# Patient Record
Sex: Female | Born: 1970 | Race: White | Hispanic: No | Marital: Married | Smoking: Never smoker
Health system: Southern US, Community
[De-identification: ages and names within clinical notes are randomized; demographics above are authoritative.]

## PROBLEM LIST (undated history)

## (undated) DIAGNOSIS — Z87442 Personal history of urinary calculi: Secondary | ICD-10-CM

## (undated) DIAGNOSIS — N200 Calculus of kidney: Secondary | ICD-10-CM

## (undated) DIAGNOSIS — C539 Malignant neoplasm of cervix uteri, unspecified: Secondary | ICD-10-CM

## (undated) DIAGNOSIS — I10 Essential (primary) hypertension: Secondary | ICD-10-CM

## (undated) DIAGNOSIS — E785 Hyperlipidemia, unspecified: Secondary | ICD-10-CM

## (undated) DIAGNOSIS — G43909 Migraine, unspecified, not intractable, without status migrainosus: Secondary | ICD-10-CM

## (undated) HISTORY — PX: ABDOMINAL HYSTERECTOMY: SHX81

## (undated) HISTORY — DX: Hyperlipidemia, unspecified: E78.5

## (undated) HISTORY — PX: BREAST ENHANCEMENT SURGERY: SHX7

---

## 2014-11-12 ENCOUNTER — Emergency Department (HOSPITAL_COMMUNITY)
Admission: EM | Admit: 2014-11-12 | Discharge: 2014-11-12 | Disposition: A | Discharge status: Home / Self Care | Payer: BLUE CROSS/BLUE SHIELD | Attending: Emergency Medicine | Admitting: Emergency Medicine

## 2014-11-12 ENCOUNTER — Emergency Department (HOSPITAL_COMMUNITY): Payer: BLUE CROSS/BLUE SHIELD

## 2014-11-12 ENCOUNTER — Encounter (HOSPITAL_COMMUNITY): Payer: Self-pay

## 2014-11-12 DIAGNOSIS — R109 Unspecified abdominal pain: Secondary | ICD-10-CM

## 2014-11-12 DIAGNOSIS — N200 Calculus of kidney: Secondary | ICD-10-CM | POA: Diagnosis not present

## 2014-11-12 DIAGNOSIS — I10 Essential (primary) hypertension: Secondary | ICD-10-CM | POA: Insufficient documentation

## 2014-11-12 DIAGNOSIS — R1032 Left lower quadrant pain: Secondary | ICD-10-CM | POA: Diagnosis present

## 2014-11-12 DIAGNOSIS — Z880 Allergy status to penicillin: Secondary | ICD-10-CM | POA: Diagnosis not present

## 2014-11-12 DIAGNOSIS — Z79899 Other long term (current) drug therapy: Secondary | ICD-10-CM | POA: Diagnosis not present

## 2014-11-12 HISTORY — DX: Essential (primary) hypertension: I10

## 2014-11-12 HISTORY — DX: Calculus of kidney: N20.0

## 2014-11-12 LAB — CBC WITH DIFFERENTIAL/PLATELET
BASOS PCT: 0 % (ref 0–1)
Basophils Absolute: 0 10*3/uL (ref 0.0–0.1)
EOS ABS: 0 10*3/uL (ref 0.0–0.7)
EOS PCT: 0 % (ref 0–5)
HEMATOCRIT: 37.3 % (ref 36.0–46.0)
HEMOGLOBIN: 12.5 g/dL (ref 12.0–15.0)
LYMPHS PCT: 10 % — AB (ref 12–46)
Lymphs Abs: 1.8 10*3/uL (ref 0.7–4.0)
MCH: 30 pg (ref 26.0–34.0)
MCHC: 33.5 g/dL (ref 30.0–36.0)
MCV: 89.7 fL (ref 78.0–100.0)
MONO ABS: 1.6 10*3/uL — AB (ref 0.1–1.0)
MONOS PCT: 9 % (ref 3–12)
NEUTROS ABS: 14.7 10*3/uL — AB (ref 1.7–7.7)
Neutrophils Relative %: 81 % — ABNORMAL HIGH (ref 43–77)
Platelets: 232 10*3/uL (ref 150–400)
RBC: 4.16 MIL/uL (ref 3.87–5.11)
RDW: 12.7 % (ref 11.5–15.5)
WBC: 18.1 10*3/uL — AB (ref 4.0–10.5)

## 2014-11-12 LAB — URINE MICROSCOPIC-ADD ON

## 2014-11-12 LAB — BASIC METABOLIC PANEL
ANION GAP: 11 (ref 5–15)
BUN: 17 mg/dL (ref 6–20)
CALCIUM: 9.1 mg/dL (ref 8.9–10.3)
CO2: 26 mmol/L (ref 22–32)
Chloride: 97 mmol/L — ABNORMAL LOW (ref 101–111)
Creatinine, Ser: 1.57 mg/dL — ABNORMAL HIGH (ref 0.44–1.00)
GFR calc non Af Amer: 39 mL/min — ABNORMAL LOW (ref >60)
GFR, EST AFRICAN AMERICAN: 45 mL/min — AB (ref >60)
GLUCOSE: 125 mg/dL — AB (ref 65–99)
Potassium: 3.3 mmol/L — ABNORMAL LOW (ref 3.5–5.1)
Sodium: 134 mmol/L — ABNORMAL LOW (ref 135–145)

## 2014-11-12 LAB — URINALYSIS, ROUTINE W REFLEX MICROSCOPIC
Bilirubin Urine: NEGATIVE
Glucose, UA: NEGATIVE mg/dL
Ketones, ur: NEGATIVE mg/dL
Leukocytes, UA: NEGATIVE
Nitrite: NEGATIVE
PH: 5.5 (ref 5.0–8.0)
Specific Gravity, Urine: >1.03 — ABNORMAL HIGH (ref 1.005–1.030)
Urobilinogen, UA: 0.2 mg/dL (ref 0.0–1.0)

## 2014-11-12 MED ORDER — ONDANSETRON HCL 4 MG/2ML IJ SOLN
4.0000 mg | Freq: Once | INTRAMUSCULAR | Status: AC
  Administered 2014-11-12: 4 mg via INTRAVENOUS
  Filled 2014-11-12: qty 2

## 2014-11-12 MED ORDER — HYDROMORPHONE HCL 1 MG/ML IJ SOLN
1.0000 mg | Freq: Once | INTRAMUSCULAR | Status: AC
  Administered 2014-11-12: 1 mg via INTRAVENOUS
  Filled 2014-11-12: qty 1

## 2014-11-12 MED ORDER — ONDANSETRON HCL 4 MG PO TABS: 4.0000 mg | ORAL_TABLET | Freq: Four times a day (QID) | ORAL | Status: DC

## 2014-11-12 MED ORDER — OXYCODONE-ACETAMINOPHEN 5-325 MG PO TABS: 1.0000 | ORAL_TABLET | ORAL | Status: DC | PRN

## 2014-11-12 MED ORDER — KETOROLAC TROMETHAMINE 30 MG/ML IJ SOLN
30.0000 mg | Freq: Once | INTRAMUSCULAR | Status: AC
  Administered 2014-11-12: 30 mg via INTRAVENOUS
  Filled 2014-11-12: qty 1

## 2014-11-12 NOTE — ED Notes (Signed)
Left flank pain that started on Wednesday, seen at urgent care for same, told probably a kidney stone but not imaging done for same. Pt does have history of kidney stones

## 2014-11-12 NOTE — Discharge Instructions (Signed)

## 2014-11-12 NOTE — ED Provider Notes (Signed)
CSN: [id number]     Arrival date & time 11/12/14  1771 History   First MD Initiated Contact with Patient 11/12/14 [phone]     Chief Complaint  Patient presents with  . Flank Pain     (Consider location/radiation/quality/duration/timing/severity/associated sxs/prior Treatment) HPI Comments: Patient presents to the ER for evaluation of left flank pain. Patient reports that symptoms began 2 days ago. Patient has been having constant and severe pain in the left flank since it began. She was seen in urgent care yesterday and was diagnosed with a kidney stone. She does have a history of kidney stones, but has not had any for 10 years. Patient was placed on hydrocodone and Flomax, but is having severe pain without any relief. She has had associated nausea and vomiting.(  Patient is a 44 y.o. female presenting with flank pain.  Flank Pain    Past Medical History  Diagnosis Date  . Kidney stones   . Hypertension    Past Surgical History  Procedure Laterality Date  . Abdominal hysterectomy     No family history on file. History  Substance Use Topics  . Smoking status: Never Smoker   . Smokeless tobacco: Not on file  . Alcohol Use: No   OB History    No data available     Review of Systems  Gastrointestinal: Positive for nausea and vomiting.  Genitourinary: Positive for flank pain.  All other systems reviewed and are negative.     Allergies  Penicillins  Home Medications   Prior to Admission medications   Medication Sig Start Date End Date Taking? Authorizing Provider  diphenhydrAMINE (SOMINEX) 25 MG tablet Take 25 mg by mouth at bedtime as needed for sleep.   Yes Historical Provider, MD  HYDROcodone-acetaminophen (NORCO/VICODIN) 5-325 MG per tablet Take 1 tablet by mouth every 6 (six) hours as needed for moderate pain.   Yes Historical Provider, MD  tamsulosin (FLOMAX) 0.4 MG CAPS capsule Take 0.4 mg by mouth.   Yes Historical Provider, MD  verapamil (CALAN) 40 MG tablet  Take 40 mg by mouth 3 (three) times daily.   Yes Historical Provider, MD  vitamin B-12 (CYANOCOBALAMIN) 100 MCG tablet Take 100 mcg by mouth daily.   Yes Historical Provider, MD   BP 114/66 mmHg  Pulse 105  Temp(Src) 98 F (36.7 C) (Oral)  Resp 16  Ht 5\' 5"  (1.651 m)  Wt 130 lb (58.968 kg)  BMI 21.63 kg/m2  SpO2 100% Physical Exam  Constitutional: She is oriented to person, place, and time. She appears well-developed and well-nourished. No distress.  HENT:  Head: Normocephalic and atraumatic.  Right Ear: Hearing normal.  Left Ear: Hearing normal.  Nose: Nose normal.  Mouth/Throat: Oropharynx is clear and moist and mucous membranes are normal.  Eyes: Conjunctivae and EOM are normal. Pupils are equal, round, and reactive to light.  Neck: Normal range of motion. Neck supple.  Cardiovascular: Regular rhythm, S1 normal and S2 normal.  Exam reveals no gallop and no friction rub.   No murmur heard. Pulmonary/Chest: Effort normal and breath sounds normal. No respiratory distress. She exhibits no tenderness.  Abdominal: Soft. Normal appearance and bowel sounds are normal. There is no hepatosplenomegaly. There is no tenderness. There is no rebound, no guarding, no tenderness at McBurney's point and negative Murphy's sign. No hernia.  Musculoskeletal: Normal range of motion.  Neurological: She is alert and oriented to person, place, and time. She has normal strength. No cranial nerve deficit or sensory deficit. Coordination  normal. GCS eye subscore is 4. GCS verbal subscore is 5. GCS motor subscore is 6.  Skin: Skin is warm, dry and intact. No rash noted. No cyanosis.  Psychiatric: She has a normal mood and affect. Her speech is normal and behavior is normal. Thought content normal.  Nursing note and vitals reviewed.   ED Course  Procedures (including critical care time) Labs Review Labs Reviewed  CBC WITH DIFFERENTIAL/PLATELET  BASIC METABOLIC PANEL  URINALYSIS, ROUTINE W REFLEX  MICROSCOPIC (NOT AT [facility])    Imaging Review No results found.   EKG Interpretation None      MDM   Final diagnoses:  Left flank pain   Patient with left flank pain consistent with renal colic. Patient has had persistent symptoms for 2 days. She was provided IV analgesia here in the ER. Patient will undergo CT scan to confirm diagnosis, identify size of stone and position of stone. Patient will require urology follow-up. Will be signed out to oncoming ER physician. Follow-up on CT scan and disposition the patient.    Orpah Greek, MD 11/12/14 [phone]

## 2014-11-12 NOTE — ED Provider Notes (Signed)
Ct Renal Stone Study  11/12/2014   CLINICAL DATA:  Two day history of left flank pain with hematuria  EXAM: CT ABDOMEN AND PELVIS WITHOUT CONTRAST  TECHNIQUE: Multidetector CT imaging of the abdomen and pelvis was performed following the standard protocol without oral or intravenous contrast material administration.  COMPARISON:  None.  FINDINGS: There is a small calcified granuloma in the lateral segment of the left lower lobe. There is slight bibasilar lung atelectatic change. Breast implants are noted bilaterally. There is a small hiatal hernia.  No focal liver lesions are identified on this noncontrast enhanced study. The gallbladder wall is not thickened. There is no biliary duct dilatation.  Spleen, pancreas, and adrenals appear normal.  Right kidney shows no mass or hydronephrosis. There is a 1 mm calculus in the lower pole the right kidney. There is no demonstrable ureteral calculus on the right.  On the left, there is moderately severe hydronephrosis and perinephric edema. There is no left renal mass or intrarenal calculus. On axial slice 82 series 2, there is a 1 mm calculus in the distal left ureter. There is surrounding edema in the distal left ureter. No other ureteral calculi are identified. There are several phleboliths which are near but separate from the distal left ureter.  In the pelvis, the urinary bladder is midline with normal wall thickness. There is no pelvic mass. There is free fluid in the dependent portion of the pelvis. Uterus is absent. Appendix appears normal.  There is moderate air in the colon. There is no bowel obstruction. No free air or portal venous air.  There is no adenopathy or abscess in the abdomen or pelvis. There is no abdominal aortic aneurysm. There are no blastic or lytic bone lesions.  IMPRESSION: There is a 1 mm calculus in the distal left ureter causing moderately severe hydronephrosis and proximal ureterectasis on the left. There is moderate perinephric stranding/  edema on the left.  On the right, there is a 1 mm nonobstructing calculus in the lower pole of the right kidney. No right renal hydronephrosis or ureteral calculus.  There is free fluid in the cul-de-sac. This free fluid could be sympathetic response to the distal ureteral calculus on the left or also could be indicative of recent ovarian cyst rupture. Uterus absent.  Appendix appears normal. No bowel obstruction. No abscess. There is a small hiatal hernia.   Electronically Signed   By: Lowella Grip III M.D.   On: 11/12/2014 07:13   Assumed care in sign out with CT pending. As above. Pt/significant other updated. She appears comfortable currently. Reports improved symptoms. Prescribed six tablets of vicodin by urgent care and only taking one at a time. Prescription for additional pain meds as well as zofran provided. Return precautions dicussed. Urology information provided.   Virgel Manifold, MD 11/12/14 0730

## 2014-11-15 ENCOUNTER — Emergency Department (HOSPITAL_COMMUNITY): Payer: BLUE CROSS/BLUE SHIELD

## 2014-11-15 ENCOUNTER — Encounter (HOSPITAL_COMMUNITY): Payer: Self-pay | Admitting: Emergency Medicine

## 2014-11-15 ENCOUNTER — Emergency Department (HOSPITAL_COMMUNITY)
Admission: EM | Admit: 2014-11-15 | Discharge: 2014-11-15 | Disposition: A | Discharge status: Home / Self Care | Payer: BLUE CROSS/BLUE SHIELD | Attending: Emergency Medicine | Admitting: Emergency Medicine

## 2014-11-15 DIAGNOSIS — Z79899 Other long term (current) drug therapy: Secondary | ICD-10-CM | POA: Insufficient documentation

## 2014-11-15 DIAGNOSIS — I10 Essential (primary) hypertension: Secondary | ICD-10-CM | POA: Diagnosis not present

## 2014-11-15 DIAGNOSIS — R109 Unspecified abdominal pain: Secondary | ICD-10-CM | POA: Diagnosis present

## 2014-11-15 DIAGNOSIS — Z880 Allergy status to penicillin: Secondary | ICD-10-CM | POA: Diagnosis not present

## 2014-11-15 DIAGNOSIS — N200 Calculus of kidney: Secondary | ICD-10-CM | POA: Diagnosis not present

## 2014-11-15 LAB — CBC WITH DIFFERENTIAL/PLATELET
BASOS ABS: 0 10*3/uL (ref 0.0–0.1)
Basophils Relative: 0 % (ref 0–1)
Eosinophils Absolute: 0.1 10*3/uL (ref 0.0–0.7)
Eosinophils Relative: 2 % (ref 0–5)
HEMATOCRIT: 34.4 % — AB (ref 36.0–46.0)
Hemoglobin: 11.6 g/dL — ABNORMAL LOW (ref 12.0–15.0)
LYMPHS PCT: 34 % (ref 12–46)
Lymphs Abs: 2 10*3/uL (ref 0.7–4.0)
MCH: 30.1 pg (ref 26.0–34.0)
MCHC: 33.7 g/dL (ref 30.0–36.0)
MCV: 89.4 fL (ref 78.0–100.0)
Monocytes Absolute: 0.4 10*3/uL (ref 0.1–1.0)
Monocytes Relative: 7 % (ref 3–12)
NEUTROS ABS: 3.4 10*3/uL (ref 1.7–7.7)
NEUTROS PCT: 57 % (ref 43–77)
Platelets: 237 10*3/uL (ref 150–400)
RBC: 3.85 MIL/uL — AB (ref 3.87–5.11)
RDW: 12.4 % (ref 11.5–15.5)
WBC: 5.9 10*3/uL (ref 4.0–10.5)

## 2014-11-15 LAB — COMPREHENSIVE METABOLIC PANEL
ALT: 61 U/L — AB (ref 14–54)
ANION GAP: 12 (ref 5–15)
AST: 57 U/L — AB (ref 15–41)
Albumin: 4 g/dL (ref 3.5–5.0)
Alkaline Phosphatase: 71 U/L (ref 38–126)
BUN: 13 mg/dL (ref 6–20)
CHLORIDE: 101 mmol/L (ref 101–111)
CO2: 28 mmol/L (ref 22–32)
CREATININE: 1 mg/dL (ref 0.44–1.00)
Calcium: 9.4 mg/dL (ref 8.9–10.3)
GFR calc Af Amer: >60 mL/min (ref >60)
GLUCOSE: 96 mg/dL (ref 65–99)
Potassium: 3.6 mmol/L (ref 3.5–5.1)
Sodium: 141 mmol/L (ref 135–145)
Total Bilirubin: 0.5 mg/dL (ref 0.3–1.2)
Total Protein: 8.1 g/dL (ref 6.5–8.1)

## 2014-11-15 LAB — URINALYSIS, ROUTINE W REFLEX MICROSCOPIC
Bilirubin Urine: NEGATIVE
Glucose, UA: NEGATIVE mg/dL
KETONES UR: NEGATIVE mg/dL
Leukocytes, UA: NEGATIVE
Nitrite: NEGATIVE
PROTEIN: 30 mg/dL — AB
Specific Gravity, Urine: <1.005 — ABNORMAL LOW (ref 1.005–1.030)
Urobilinogen, UA: 0.2 mg/dL (ref 0.0–1.0)
pH: 6.5 (ref 5.0–8.0)

## 2014-11-15 LAB — URINE MICROSCOPIC-ADD ON

## 2014-11-15 MED ORDER — HYDROMORPHONE HCL 4 MG PO TABS: 4.0000 mg | ORAL_TABLET | ORAL | Status: DC | PRN

## 2014-11-15 MED ORDER — HYDROMORPHONE HCL 1 MG/ML IJ SOLN
1.0000 mg | Freq: Once | INTRAMUSCULAR | Status: AC
  Administered 2014-11-15: 1 mg via INTRAVENOUS
  Filled 2014-11-15: qty 1

## 2014-11-15 MED ORDER — PROMETHAZINE HCL 25 MG PO TABS: 25.0000 mg | ORAL_TABLET | Freq: Four times a day (QID) | ORAL | Status: DC | PRN

## 2014-11-15 MED ORDER — KETOROLAC TROMETHAMINE 30 MG/ML IJ SOLN
30.0000 mg | Freq: Once | INTRAMUSCULAR | Status: AC
  Administered 2014-11-15: 30 mg via INTRAVENOUS
  Filled 2014-11-15: qty 1

## 2014-11-15 MED ORDER — CIPROFLOXACIN HCL 250 MG PO TABS
500.0000 mg | ORAL_TABLET | Freq: Once | ORAL | Status: AC
  Administered 2014-11-15: 500 mg via ORAL
  Filled 2014-11-15: qty 2

## 2014-11-15 MED ORDER — CIPROFLOXACIN HCL 500 MG PO TABS: 500.0000 mg | ORAL_TABLET | Freq: Two times a day (BID) | ORAL | Status: DC

## 2014-11-15 MED ORDER — ONDANSETRON HCL 4 MG/2ML IJ SOLN
4.0000 mg | Freq: Once | INTRAMUSCULAR | Status: AC
  Administered 2014-11-15: 4 mg via INTRAVENOUS
  Filled 2014-11-15: qty 2

## 2014-11-15 NOTE — Discharge Instructions (Signed)
Follow up with the urologist as planned °

## 2014-11-15 NOTE — ED Notes (Signed)
Pt states that on Wednesday she started having pain in left flank and then today started having pain on right flank.  Pt states there is a lot of blood in urine.

## 2014-11-15 NOTE — ED Provider Notes (Signed)
CSN: [id number]     Arrival date & time 11/15/14  1809 History   First MD Initiated Contact with Patient 11/15/14 1830     Chief Complaint  Patient presents with  . Flank Pain     (Consider location/radiation/quality/duration/timing/severity/associated sxs/prior Treatment) Patient is a 44 y.o. female presenting with flank pain. The history is provided by the patient (pt states she started back again with the flank pain).  Flank Pain This is a recurrent problem. The current episode started 6 to 12 hours ago. The problem occurs constantly. The problem has not changed since onset.Associated symptoms include abdominal pain. Pertinent negatives include no chest pain and no headaches. Nothing aggravates the symptoms. Nothing relieves the symptoms.    Past Medical History  Diagnosis Date  . Kidney stones   . Hypertension    Past Surgical History  Procedure Laterality Date  . Abdominal hysterectomy    . Breast enhancement surgery     History reviewed. No pertinent family history. History  Substance Use Topics  . Smoking status: Never Smoker   . Smokeless tobacco: Not on file  . Alcohol Use: No   OB History    No data available     Review of Systems  Constitutional: Negative for appetite change and fatigue.  HENT: Negative for congestion, ear discharge and sinus pressure.   Eyes: Negative for discharge.  Respiratory: Negative for cough.   Cardiovascular: Negative for chest pain.  Gastrointestinal: Positive for abdominal pain. Negative for diarrhea.  Genitourinary: Positive for flank pain. Negative for frequency and hematuria.  Musculoskeletal: Negative for back pain.  Skin: Negative for rash.  Neurological: Negative for seizures and headaches.  Psychiatric/Behavioral: Negative for hallucinations.      Allergies  Penicillins  Home Medications   Prior to Admission medications   Medication Sig Start Date End Date Taking? Authorizing Provider  Bisacodyl (LAXATIVE PO)  Take 1 tablet by mouth daily as needed (FOR CONSTIPATION).   Yes Historical Provider, MD  diphenhydrAMINE (SOMINEX) 25 MG tablet Take 25 mg by mouth at bedtime as needed for sleep.   Yes Historical Provider, MD  ibuprofen (ADVIL,MOTRIN) 200 MG tablet Take 200 mg by mouth every 6 (six) hours as needed for fever or moderate pain.   Yes Historical Provider, MD  Multiple Vitamins-Minerals (HAIR/SKIN/NAILS PO) Take 1 tablet by mouth daily.   Yes Historical Provider, MD  oxyCODONE-acetaminophen (PERCOCET) 5-325 MG per tablet Take 1-2 tablets by mouth every 4 (four) hours as needed. 11/12/14  Yes Orpah Greek, MD  tamsulosin (FLOMAX) 0.4 MG CAPS capsule Take 0.4 mg by mouth.   Yes Historical Provider, MD  verapamil (VERELAN PM) 180 MG 24 hr capsule Take 180 mg by mouth daily. 11/12/14  Yes Historical Provider, MD  vitamin B-12 (CYANOCOBALAMIN) 100 MCG tablet Take 100 mcg by mouth daily.   Yes Historical Provider, MD  ciprofloxacin (CIPRO) 500 MG tablet Take 1 tablet (500 mg total) by mouth 2 (two) times daily. One po bid x 7 days 11/15/14   Milton Ferguson, MD  HYDROmorphone (DILAUDID) 4 MG tablet Take 1 tablet (4 mg total) by mouth every 4 (four) hours as needed for severe pain. 11/15/14   Milton Ferguson, MD  ondansetron (ZOFRAN) 4 MG tablet Take 1 tablet (4 mg total) by mouth every 6 (six) hours. Patient not taking: Reported on 11/15/2014 11/12/14   Orpah Greek, MD  promethazine (PHENERGAN) 25 MG tablet Take 1 tablet (25 mg total) by mouth every 6 (six) hours as needed  for nausea or vomiting. 11/15/14   Milton Ferguson, MD   BP 154/87 mmHg  Pulse 97  Temp(Src) 98.7 F (37.1 C) (Oral)  Resp 16  Ht 5\' 6"  (1.676 m)  Wt 130 lb (58.968 kg)  BMI 20.99 kg/m2  SpO2 100% Physical Exam  Constitutional: She is oriented to person, place, and time. She appears well-developed.  HENT:  Head: Normocephalic.  Eyes: Conjunctivae and EOM are normal. No scleral icterus.  Neck: Neck supple. No thyromegaly  present.  Cardiovascular: Normal rate and regular rhythm.  Exam reveals no gallop and no friction rub.   No murmur heard. Pulmonary/Chest: No stridor. She has no wheezes. She has no rales. She exhibits no tenderness.  Abdominal: She exhibits no distension. There is no tenderness. There is no rebound.  Musculoskeletal: Normal range of motion. She exhibits no edema.  Tender left flank  Lymphadenopathy:    She has no cervical adenopathy.  Neurological: She is oriented to person, place, and time. She exhibits normal muscle tone. Coordination normal.  Skin: No rash noted. No erythema.  Psychiatric: She has a normal mood and affect. Her behavior is normal.    ED Course  Procedures (including critical care time) Labs Review Labs Reviewed  URINALYSIS, ROUTINE W REFLEX MICROSCOPIC (NOT AT [facility]) - Abnormal; Notable for the following:    Color, Urine RED (*)    Specific Gravity, Urine <1.005 (*)    Hgb urine dipstick LARGE (*)    Protein, ur 30 (*)    All other components within normal limits  CBC WITH DIFFERENTIAL/PLATELET - Abnormal; Notable for the following:    RBC 3.85 (*)    Hemoglobin 11.6 (*)    HCT 34.4 (*)    All other components within normal limits  COMPREHENSIVE METABOLIC PANEL - Abnormal; Notable for the following:    AST 57 (*)    ALT 61 (*)    All other components within normal limits  URINE MICROSCOPIC-ADD ON - Abnormal; Notable for the following:    Bacteria, UA FEW (*)    All other components within normal limits  URINE CULTURE    Imaging Review Dg Abd 1 View  11/15/2014   CLINICAL DATA:  Left flank pain five days ago now right flank pain. Hematuria.  EXAM: ABDOMEN - 1 VIEW  COMPARISON:  None.  FINDINGS: Examination demonstrates multiple air-filled nondilated loops of large and small bowel. Mild fecal retention within the left colon. No free peritoneal air. No evidence of abdominal mass or mass effect. Couple pelvic phleboliths are present. Remaining bony structures  are within normal.  IMPRESSION: Nonobstructive bowel gas pattern.   Electronically Signed   By: Marin Olp M.D.   On: 11/15/2014 19:15     EKG Interpretation None      MDM   Final diagnoses:  Kidney stone    Kidney stone.  Pt improved with dilaudid.  Will rx dilaudid and she is to follow up this week    Milton Ferguson, MD 11/15/14 [phone]

## 2014-11-17 LAB — URINE CULTURE
CULTURE: NO GROWTH
Colony Count: NO GROWTH

## 2019-07-20 DIAGNOSIS — I5181 Takotsubo syndrome: Secondary | ICD-10-CM

## 2019-07-20 HISTORY — DX: Takotsubo syndrome: I51.81

## 2019-08-04 ENCOUNTER — Encounter (HOSPITAL_COMMUNITY): Payer: Self-pay

## 2019-08-04 ENCOUNTER — Other Ambulatory Visit: Payer: Self-pay

## 2019-08-04 ENCOUNTER — Emergency Department (HOSPITAL_COMMUNITY): Payer: BC Managed Care – PPO

## 2019-08-04 ENCOUNTER — Inpatient Hospital Stay (HOSPITAL_COMMUNITY)
Admission: EM | Admit: 2019-08-04 | Discharge: 2019-08-06 | DRG: 282 | Disposition: A | Discharge status: Home / Self Care | Payer: BC Managed Care – PPO | Attending: Cardiovascular Disease | Admitting: Cardiovascular Disease

## 2019-08-04 DIAGNOSIS — I214 Non-ST elevation (NSTEMI) myocardial infarction: Secondary | ICD-10-CM | POA: Diagnosis present

## 2019-08-04 DIAGNOSIS — Z880 Allergy status to penicillin: Secondary | ICD-10-CM

## 2019-08-04 DIAGNOSIS — I21A1 Myocardial infarction type 2: Secondary | ICD-10-CM | POA: Diagnosis present

## 2019-08-04 DIAGNOSIS — Z833 Family history of diabetes mellitus: Secondary | ICD-10-CM

## 2019-08-04 DIAGNOSIS — I10 Essential (primary) hypertension: Secondary | ICD-10-CM | POA: Diagnosis present

## 2019-08-04 DIAGNOSIS — Z20822 Contact with and (suspected) exposure to covid-19: Secondary | ICD-10-CM | POA: Diagnosis present

## 2019-08-04 DIAGNOSIS — Z79899 Other long term (current) drug therapy: Secondary | ICD-10-CM | POA: Diagnosis not present

## 2019-08-04 DIAGNOSIS — Z8541 Personal history of malignant neoplasm of cervix uteri: Secondary | ICD-10-CM

## 2019-08-04 DIAGNOSIS — Z823 Family history of stroke: Secondary | ICD-10-CM

## 2019-08-04 DIAGNOSIS — R079 Chest pain, unspecified: Secondary | ICD-10-CM | POA: Diagnosis not present

## 2019-08-04 DIAGNOSIS — I5181 Takotsubo syndrome: Principal | ICD-10-CM | POA: Diagnosis present

## 2019-08-04 DIAGNOSIS — E785 Hyperlipidemia, unspecified: Secondary | ICD-10-CM | POA: Diagnosis present

## 2019-08-04 HISTORY — DX: Personal history of urinary calculi: Z87.442

## 2019-08-04 HISTORY — DX: Migraine, unspecified, not intractable, without status migrainosus: G43.909

## 2019-08-04 HISTORY — DX: Malignant neoplasm of cervix uteri, unspecified: C53.9

## 2019-08-04 LAB — RAPID URINE DRUG SCREEN, HOSP PERFORMED
Amphetamines: NOT DETECTED
Barbiturates: NOT DETECTED
Benzodiazepines: NOT DETECTED
Cocaine: NOT DETECTED
Opiates: POSITIVE — AB
Tetrahydrocannabinol: NOT DETECTED

## 2019-08-04 LAB — BASIC METABOLIC PANEL
Anion gap: 11 (ref 5–15)
BUN: 14 mg/dL (ref 6–20)
CO2: 23 mmol/L (ref 22–32)
Calcium: 10 mg/dL (ref 8.9–10.3)
Chloride: 104 mmol/L (ref 98–111)
Creatinine, Ser: 1.1 mg/dL — ABNORMAL HIGH (ref 0.44–1.00)
GFR calc Af Amer: >60 mL/min (ref >60)
GFR calc non Af Amer: 58 mL/min — ABNORMAL LOW (ref >60)
Glucose, Bld: 109 mg/dL — ABNORMAL HIGH (ref 70–99)
Potassium: 4 mmol/L (ref 3.5–5.1)
Sodium: 138 mmol/L (ref 135–145)

## 2019-08-04 LAB — HEPARIN LEVEL (UNFRACTIONATED): Heparin Unfractionated: 0.49 IU/mL (ref 0.30–0.70)

## 2019-08-04 LAB — TROPONIN I (HIGH SENSITIVITY)
Troponin I (High Sensitivity): 1434 ng/L (ref <18)
Troponin I (High Sensitivity): 1461 ng/L (ref <18)

## 2019-08-04 LAB — CBC
HCT: 42 % (ref 36.0–46.0)
Hemoglobin: 14 g/dL (ref 12.0–15.0)
MCH: 30.2 pg (ref 26.0–34.0)
MCHC: 33.3 g/dL (ref 30.0–36.0)
MCV: 90.5 fL (ref 80.0–100.0)
Platelets: 299 10*3/uL (ref 150–400)
RBC: 4.64 MIL/uL (ref 3.87–5.11)
RDW: 12.4 % (ref 11.5–15.5)
WBC: 11.4 10*3/uL — ABNORMAL HIGH (ref 4.0–10.5)
nRBC: 0 % (ref 0.0–0.2)

## 2019-08-04 LAB — RESPIRATORY PANEL BY RT PCR (FLU A&B, COVID)
Influenza A by PCR: NEGATIVE
Influenza B by PCR: NEGATIVE
SARS Coronavirus 2 by RT PCR: NEGATIVE

## 2019-08-04 LAB — I-STAT BETA HCG BLOOD, ED (MC, WL, AP ONLY): I-stat hCG, quantitative: <5 m[IU]/mL (ref <5)

## 2019-08-04 LAB — HIV ANTIBODY (ROUTINE TESTING W REFLEX): HIV Screen 4th Generation wRfx: NONREACTIVE

## 2019-08-04 MED ORDER — HEPARIN (PORCINE) 25000 UT/250ML-% IV SOLN
750.0000 [IU]/h | INTRAVENOUS | Status: DC
  Administered 2019-08-04: 750 [IU]/h via INTRAVENOUS
  Filled 2019-08-04: qty 250

## 2019-08-04 MED ORDER — ASPIRIN 81 MG PO CHEW
324.0000 mg | CHEWABLE_TABLET | Freq: Once | ORAL | Status: AC
  Administered 2019-08-04: 15:00:00 324 mg via ORAL
  Filled 2019-08-04: qty 4

## 2019-08-04 MED ORDER — MORPHINE SULFATE (PF) 4 MG/ML IV SOLN
4.0000 mg | Freq: Once | INTRAVENOUS | Status: AC
  Administered 2019-08-04: 15:00:00 4 mg via INTRAVENOUS
  Filled 2019-08-04: qty 1

## 2019-08-04 MED ORDER — ASPIRIN EC 81 MG PO TBEC: 81.0000 mg | DELAYED_RELEASE_TABLET | Freq: Every day | ORAL | Status: DC

## 2019-08-04 MED ORDER — HEPARIN BOLUS VIA INFUSION
4000.0000 [IU] | Freq: Once | INTRAVENOUS | Status: AC
  Administered 2019-08-04: 15:00:00 4000 [IU] via INTRAVENOUS
  Filled 2019-08-04: qty 4000

## 2019-08-04 MED ORDER — ONDANSETRON HCL 4 MG/2ML IJ SOLN: 4.0000 mg | Freq: Four times a day (QID) | INTRAMUSCULAR | Status: DC | PRN

## 2019-08-04 MED ORDER — ACETAMINOPHEN 325 MG PO TABS
650.0000 mg | ORAL_TABLET | ORAL | Status: DC | PRN
  Administered 2019-08-04 – 2019-08-05 (×2): 650 mg via ORAL
  Filled 2019-08-04 (×2): qty 2

## 2019-08-04 MED ORDER — SODIUM CHLORIDE 0.9 % IV BOLUS
500.0000 mL | Freq: Once | INTRAVENOUS | Status: AC
  Administered 2019-08-04: 22:00:00 500 mL via INTRAVENOUS

## 2019-08-04 MED ORDER — NITROGLYCERIN 0.4 MG SL SUBL: 0.4000 mg | SUBLINGUAL_TABLET | SUBLINGUAL | Status: DC | PRN

## 2019-08-04 MED ORDER — IOHEXOL 350 MG/ML SOLN
100.0000 mL | Freq: Once | INTRAVENOUS | Status: AC | PRN
  Administered 2019-08-04: 100 mL via INTRAVENOUS

## 2019-08-04 MED ORDER — LISINOPRIL 10 MG PO TABS: 10.0000 mg | ORAL_TABLET | Freq: Every day | ORAL | Status: DC

## 2019-08-04 MED ORDER — BUPROPION HCL ER (XL) 150 MG PO TB24
150.0000 mg | ORAL_TABLET | Freq: Every day | ORAL | Status: DC
  Administered 2019-08-04 – 2019-08-06 (×2): 150 mg via ORAL
  Filled 2019-08-04 (×2): qty 1

## 2019-08-04 NOTE — ED Provider Notes (Signed)
This patient is a very pleasant 49 year old female history of hypertension only who presents after developing a heaviness on her chest last night with a squeezing feeling radiating to her jaw and her back, the seem to get more intense and she did not sleep throughout the evening.  She tried Tums without relief.  On exam here the patient states she is feeling back to normal after receiving morphine and on heparin.  Initial troponin over 1400, EKG without definite ST elevation.  Discussed with cardiology who will come see the patient for admission.  This patient is critically ill.  .Critical Care Performed by: Noemi Chapel, MD Authorized by: Noemi Chapel, MD   Critical care provider statement:    Critical care time (minutes):  35   Critical care time was exclusive of:  Separately billable procedures and treating other patients and teaching time   Critical care was necessary to treat or prevent imminent or life-threatening deterioration of the following conditions:  Cardiac failure   Critical care was time spent personally by me on the following activities:  Blood draw for specimens, development of treatment plan with patient or surrogate, discussions with consultants, evaluation of patient's response to treatment, examination of patient, obtaining history from patient or surrogate, ordering and performing treatments and interventions, ordering and review of laboratory studies, ordering and review of radiographic studies, pulse oximetry, re-evaluation of patient's condition and review of old charts   I assumed direction of critical care for this patient from another provider in my specialty: yes     This patient is critically ill requiring heparin, consultation with cardiology and admission to the hospital.  Covid test drawn  Final diagnoses:  NSTEMI (non-ST elevated myocardial infarction) ([place])      Noemi Chapel, MD 08/04/19 1704

## 2019-08-04 NOTE — H&P (Signed)
Cardiology Admission History and Physical:   Patient ID: Kylie Rosario MRN: [id number]; DOB: Oct 14, 1970   Admission date: 08/04/2019  Primary Care Provider: [place], Julie M, NP Primary Cardiologist: No primary care provider on file.  Primary Electrophysiologist:  None   Chief Complaint:  Chest pain  Patient Profile:   Kylie Rosario is a 49 y.o. female with history of HTN and mitral valve prolapse who presented to the ED today with chest pain.   History of Present Illness:   Kylie Rosario is a 49 yo female  with history of HTN, cervical cancer, migraines and mitral valve prolapse who presented to the ED today with chest pain. She experienced the onset of chest pain last night. The pain radiated to her jaw and seemed to be worsened with inspiration. Initial troponin 1434-->1461.  In the ED, she was given morphine and started on IV heparin. She currently has no chest pain. She had an echo in 2019 at [facility] with LVEF=55-60%. 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 No aortic valve disease.   CTA chest today with no PE and no evidence of aortic dissection. EKG with sinus rhythm, no ischemia abnormalities.   She tells me that she feels better now. No chest pain currently. No dyspnea.    Past Medical History:  Diagnosis Date  . Cervical cancer ([place])   . Hypertension   . Kidney stones   . Migraines     Past Surgical History:  Procedure Laterality Date  . ABDOMINAL HYSTERECTOMY    . BREAST ENHANCEMENT SURGERY       Medications Prior to Admission: Prior to Admission medications   Medication Sig Start Date End Date Taking? Authorizing Provider  buPROPion (WELLBUTRIN XL) 150 MG 24 hr tablet Take 150 mg by mouth at bedtime. 07/14/19  Yes [provider]  lisinopril (ZESTRIL) 10 MG tablet Take 10 mg by mouth daily. 07/14/19  Yes [provider]     Allergies:    Allergies  Allergen Reactions  . Penicillins Hives    Did it involve swelling  of the face/tongue/throat, SOB, or low BP? No Did it involve sudden or severe rash/hives, skin peeling, or any reaction on the inside of your mouth or nose? Unknown Did you need to seek medical attention at a hospital or doctor's office? No When did it last happen?Pt said she was very young If all above answers are "NO", may proceed with cephalosporin use.     Social History:   Social History   Socioeconomic History  . Marital status: Married    Spouse name: Not on file  . Number of children: 1  . Years of education: Not on file  . Highest education level: Not on file  Occupational History  . Occupation: Retired Education officer, museum  Tobacco Use  . Smoking status: Never Smoker  Substance and Sexual Activity  . Alcohol use: No  . Drug use: No  . Sexual activity: Not on file  Other Topics Concern  . Not on file  Social History Narrative  . Not on file   Social Determinants of Health   Financial Resource Strain:   . Difficulty of Paying Living Expenses: Not on file  Food Insecurity:   . Worried About Charity fundraiser in the Last Year: Not on file  . Ran Out of Food in the Last Year: Not on file  Transportation Needs:   . Lack of Transportation (Medical): Not on file  . Lack of Transportation (  Non-Medical): Not on file  Physical Activity:   . Days of Exercise per Week: Not on file  . Minutes of Exercise per Session: Not on file  Stress:   . Feeling of Stress : Not on file  Social Connections:   . Frequency of Communication with Friends and Family: Not on file  . Frequency of Social Gatherings with Friends and Family: Not on file  . Attends Religious Services: Not on file  . Active Member of Clubs or Organizations: Not on file  . Attends Archivist Meetings: Not on file  . Marital Status: Not on file  Intimate Partner Violence:   . Fear of Current or Ex-Partner: Not on file  . Emotionally Abused: Not on file  . Physically Abused: Not on file  . Sexually  Abused: Not on file    Family History:   The patient's family history includes CVA in her mother; Diabetes in her father.    ROS:  Please see the history of present illness.  All other ROS reviewed and negative.     Physical Exam/Data:   Vitals:   08/04/19 1415 08/04/19 1430 08/04/19 1445 08/04/19 1715  BP: 118/65 129/81 (!) 133/59 116/75  Pulse: 98 (!) 102 (!) 102 95  Resp: 19 19 17  (!) 23  Temp:      TempSrc:      SpO2: 100% (!) 86% 100% 100%   No intake or output data in the 24 hours ending 08/04/19 1719 Last 3 Weights 11/15/2014 11/12/2014  Weight (lbs) 130 lb 130 lb  Weight (kg) 58.968 kg 58.968 kg     There is no height or weight on file to calculate BMI.  General:  Well nourished, well developed, in no acute distress HEENT: normal Lymph: no adenopathy Neck: no JVD Endocrine:  No thryomegaly Vascular: No carotid bruits; FA pulses 2+ bilaterally without bruits  Cardiac:  normal S1, S2; RRR; no murmur  Lungs:  clear to auscultation bilaterally, no wheezing, rhonchi or rales  Abd: soft, nontender, no hepatomegaly  Ext: no LE edema Musculoskeletal:  No deformities, BUE and BLE strength normal and equal Skin: warm and dry  Neuro:  CNs 2-12 intact, no focal abnormalities noted Psych:  Normal affect    EKG:  The ECG that was done was personally reviewed and demonstrates sinus with lateral T wave flattening. No prior EKG to review.   Relevant CV Studies:   Laboratory Data:  High Sensitivity Troponin:   Recent Labs  Lab 08/04/19 1213 08/04/19 1425  TROPONINIHS 1,434* 1,461*      Chemistry Recent Labs  Lab 08/04/19 1213  NA 138  K 4.0  CL 104  CO2 23  GLUCOSE 109*  BUN 14  CREATININE 1.10*  CALCIUM 10.0  GFRNONAA 58*  GFRAA >60  ANIONGAP 11    No results for input(s): PROT, ALBUMIN, AST, ALT, ALKPHOS, BILITOT in the last 168 hours. Hematology Recent Labs  Lab 08/04/19 1213  WBC 11.4*  RBC 4.64  HGB 14.0  HCT 42.0  MCV 90.5  MCH 30.2  MCHC  33.3  RDW 12.4  PLT 299   BNPNo results for input(s): BNP, PROBNP in the last 168 hours.  DDimer No results for input(s): DDIMER in the last 168 hours.   Radiology/Studies:  DG Chest 2 View  Result Date: 08/04/2019 CLINICAL DATA:  Chest pain EXAM: CHEST - 2 VIEW COMPARISON:  None. FINDINGS: The heart size and mediastinal contours are within normal limits. Both lungs are clear. The  visualized skeletal structures are unremarkable. IMPRESSION: No active cardiopulmonary disease. Electronically Signed   By: Franchot Gallo M.D.   On: 08/04/2019 12:37   CT Angio Chest PE W and/or Wo Contrast  Result Date: 08/04/2019 CLINICAL DATA:  Shortness of breath, chest pain EXAM: CT ANGIOGRAPHY CHEST WITH CONTRAST TECHNIQUE: Multidetector CT imaging of the chest was performed using the standard protocol during bolus administration of intravenous contrast. Multiplanar CT image reconstructions and MIPs were obtained to evaluate the vascular anatomy. CONTRAST:  192mL OMNIPAQUE IOHEXOL 350 MG/ML SOLN COMPARISON:  Same day chest x-ray FINDINGS: Cardiovascular: Satisfactory opacification of the pulmonary arteries to the segmental level. No evidence of pulmonary embolism. Normal heart size. No pericardial effusion. Thoracic aorta is nonaneurysmal. Mediastinum/Nodes: No enlarged mediastinal, hilar, or axillary lymph nodes. Thyroid gland, trachea, and esophagus demonstrate no significant findings. Lungs/Pleura: Minimal bibasilar dependent subsegmental atelectasis. Calcified granuloma within the posterior left lung base. No focal airspace consolidation, pleural effusion, or pneumothorax. Upper Abdomen: No acute abnormality. Musculoskeletal: No chest wall abnormality. No acute or significant osseous findings. Bilateral breast prostheses. Review of the MIP images confirms the above findings. IMPRESSION: Negative for pulmonary embolism or other acute intrathoracic abnormality. Electronically Signed   By: Davina Poke D.O.   On:  08/04/2019 17:03   { Assessment and Plan:   1. NSTEMI: She is presenting with chest pain and elevated troponin. The differential included true ACS from plaque rupture, spontaneous coronary dissection or stress induced cardiomyopathy. No evidence of PE or aortic dissection on chest CTA.  -Will admit to telemetry unit.  -Continue ASA and IV heparin.  -Will plan echo in am to assess wall motion/LV function -Will plan cardiac cath in the am unless she were to have recurrent chest pain with ST elevation tonight. I have reviewed the risks, indications, and alternatives to cardiac catheterization, possible angioplasty, and stenting with the patient. Risks include but are not limited to bleeding, infection, vascular injury, stroke, myocardial infection, arrhythmia, kidney injury, radiation-related injury in the case of prolonged fluoroscopy use, emergency cardiac surgery, and death. The patient understands the risks of serious complication is 1-2 in [id number] with diagnostic cardiac cath and 1-2% or less with angioplasty/stenting.        Severity of Illness: The appropriate patient status for this patient is INPATIENT. Inpatient status is judged to be reasonable and necessary in order to provide the required intensity of service to ensure the patient's safety. The patient's presenting symptoms, physical exam findings, and initial radiographic and laboratory data in the context of their chronic comorbidities is felt to place them at high risk for further clinical deterioration. Furthermore, it is not anticipated that the patient will be medically stable for discharge from the hospital within 2 midnights of admission. The following factors support the patient status of inpatient.   " The patient's presenting symptoms include chest pain. " The worrisome physical exam findings include none " The initial radiographic and laboratory data are worrisome because of elevated troponin " The chronic co-morbidities include  HTN   * I certify that at the point of admission it is my clinical judgment that the patient will require inpatient hospital care spanning beyond 2 midnights from the point of admission due to high intensity of service, high risk for further deterioration and high frequency of surveillance required.*    For questions or updates, please contact [place] Please consult [email or web address] for contact info under        Signed, Lauree Chandler, MD  08/04/2019 5:19  PM

## 2019-08-04 NOTE — ED Provider Notes (Addendum)
[facility] EMERGENCY DEPARTMENT Provider Note   CSN: [id number] Arrival date & time: 08/04/19  1202     History Chief Complaint  Patient presents with  . Chest Pain    Kylie Rosario is a 49 y.o. female.  The history is provided by the patient.  Chest Pain Pain location:  Substernal area Pain quality: pressure   Pain radiates to:  Upper back, L jaw and R jaw Pain severity:  Mild Onset quality:  Gradual Timing:  Intermittent Progression:  Waxing and waning Chronicity:  New Context: at rest   Relieved by:  Rest Worsened by:  Exertion Associated symptoms: shortness of breath (mild)   Associated symptoms: no abdominal pain, no AICD problem, no altered mental status, no anorexia, no anxiety, no back pain, no claudication, no cough, no diaphoresis, no dizziness, no dysphagia, no fatigue, no fever, no headache, no lower extremity edema, no nausea, no near-syncope, no numbness, no orthopnea, no palpitations, no PND, no syncope, no vomiting and no weakness   Risk factors: hypertension   Risk factors: no aortic disease, no coronary artery disease, no diabetes mellitus, no high cholesterol, no immobilization, no prior DVT/PE and no smoking        Past Medical History:  Diagnosis Date  . Hypertension   . Kidney stones     There are no problems to display for this patient.   Past Surgical History:  Procedure Laterality Date  . ABDOMINAL HYSTERECTOMY    . BREAST ENHANCEMENT SURGERY       OB History   No obstetric history on file.     No family history on file.  Social History   Tobacco Use  . Smoking status: Never Smoker  Substance Use Topics  . Alcohol use: No  . Drug use: No    Home Medications Prior to Admission medications   Medication Sig Start Date End Date Taking? Authorizing Provider  buPROPion (WELLBUTRIN XL) 150 MG 24 hr tablet Take 150 mg by mouth at bedtime. 07/14/19  Yes [provider]  lisinopril (ZESTRIL) 10 MG  tablet Take 10 mg by mouth daily. 07/14/19  Yes [provider]    Allergies    Penicillins  Review of Systems   Review of Systems  Constitutional: Negative for chills, diaphoresis, fatigue and fever.  HENT: Negative for ear pain, sore throat and trouble swallowing.   Eyes: Negative for pain and visual disturbance.  Respiratory: Positive for shortness of breath (mild). Negative for cough.   Cardiovascular: Positive for chest pain. Negative for palpitations, orthopnea, claudication, syncope, PND and near-syncope.  Gastrointestinal: Negative for abdominal pain, anorexia, nausea and vomiting.  Genitourinary: Negative for dysuria and hematuria.  Musculoskeletal: Negative for arthralgias and back pain.  Skin: Negative for color change and rash.  Neurological: Negative for dizziness, seizures, syncope, weakness, numbness and headaches.  All other systems reviewed and are negative.   Physical Exam Updated Vital Signs  ED Triage Vitals  Enc Vitals Group     BP 08/04/19 1210 (!) 125/92     Pulse Rate 08/04/19 1210 (!) 105     Resp 08/04/19 1210 18     Temp 08/04/19 1210 97.9 F (36.6 C)     Temp Source 08/04/19 1210 Oral     SpO2 08/04/19 1210 96 %     Weight --      Height --      Head Circumference --      Peak Flow --  Pain Score 08/04/19 1211 6     Pain Loc --      Pain Edu? --      Excl. in [place]? --     Physical Exam Vitals and nursing note reviewed.  Constitutional:      General: She is not in acute distress.    Appearance: She is well-developed. She is not ill-appearing.  HENT:     Head: Normocephalic and atraumatic.  Eyes:     Extraocular Movements: Extraocular movements intact.     Conjunctiva/sclera: Conjunctivae normal.     Pupils: Pupils are equal, round, and reactive to light.  Cardiovascular:     Rate and Rhythm: Normal rate and regular rhythm.     Pulses:          Radial pulses are 2+ on the right side and 2+ on the left side.       Dorsalis  pedis pulses are 2+ on the right side and 2+ on the left side.     Heart sounds: Normal heart sounds.  Pulmonary:     Effort: Pulmonary effort is normal. No respiratory distress.     Breath sounds: Normal breath sounds. No decreased breath sounds, wheezing, rhonchi or rales.  Abdominal:     Palpations: Abdomen is soft.     Tenderness: There is no abdominal tenderness.  Musculoskeletal:     Cervical back: Normal range of motion and neck supple.     Right lower leg: No edema.     Left lower leg: No edema.  Skin:    General: Skin is warm and dry.     Capillary Refill: Capillary refill takes less than 2 seconds.  Neurological:     General: No focal deficit present.     Mental Status: She is alert.  Psychiatric:        Mood and Affect: Mood normal.     ED Results / Procedures / Treatments   Labs (all labs ordered are listed, but only abnormal results are displayed) Labs Reviewed  BASIC METABOLIC PANEL - Abnormal; Notable for the following components:      Result Value   Glucose, Bld 109 (*)    Creatinine, Ser 1.10 (*)    GFR calc non Af Amer 58 (*)    All other components within normal limits  CBC - Abnormal; Notable for the following components:   WBC 11.4 (*)    All other components within normal limits  TROPONIN I (HIGH SENSITIVITY) - Abnormal; Notable for the following components:   Troponin I (High Sensitivity) 1,434 (*)    All other components within normal limits  TROPONIN I (HIGH SENSITIVITY) - Abnormal; Notable for the following components:   Troponin I (High Sensitivity) 1,461 (*)    All other components within normal limits  RESPIRATORY PANEL BY RT PCR (FLU A&B, COVID)  HEPARIN LEVEL (UNFRACTIONATED)  RAPID URINE DRUG SCREEN, HOSP PERFORMED  I-STAT BETA HCG BLOOD, ED ([facility], [facility], [facility] ONLY)    EKG EKG Interpretation  Date/Time:  Tuesday August 04 2019 12:07:09 EST Ventricular Rate:  103 PR Interval:  130 QRS Duration: 86 QT Interval:  388 QTC  Calculation: 508 R Axis:   62 Text Interpretation: Sinus tachycardia Possible Left atrial enlargement Nonspecific T wave abnormality Abnormal ECG Confirmed by Lennice Sites [phone]) on 08/04/2019 2:05:32 PM   Radiology DG Chest 2 View  Result Date: 08/04/2019 CLINICAL DATA:  Chest pain EXAM: CHEST - 2 VIEW COMPARISON:  None. FINDINGS: The heart size and mediastinal  contours are within normal limits. Both lungs are clear. The visualized skeletal structures are unremarkable. IMPRESSION: No active cardiopulmonary disease. Electronically Signed   By: Franchot Gallo M.D.   On: 08/04/2019 12:37    Procedures .Critical Care Performed by: Lennice Sites, DO Authorized by: Lennice Sites, DO   Critical care provider statement:    Critical care time (minutes):  40   Critical care was necessary to treat or prevent imminent or life-threatening deterioration of the following conditions:  Cardiac failure   Critical care was time spent personally by me on the following activities:  Blood draw for specimens, development of treatment plan with patient or surrogate, evaluation of patient's response to treatment, examination of patient, obtaining history from patient or surrogate, ordering and performing treatments and interventions, ordering and review of laboratory studies, ordering and review of radiographic studies, pulse oximetry, re-evaluation of patient's condition and review of old charts   I assumed direction of critical care for this patient from another provider in my specialty: no     (including critical care time)  Medications Ordered in ED Medications  heparin ADULT infusion 100 units/mL (25000 units/270mL sodium chloride 0.45%) (750 Units/hr Intravenous New Bag/Given 08/04/19 1453)  heparin bolus via infusion 4,000 Units (4,000 Units Intravenous Bolus from Bag 08/04/19 1453)  aspirin chewable tablet 324 mg (324 mg Oral Given 08/04/19 1513)  morphine 4 MG/ML injection 4 mg (4 mg Intravenous Given  08/04/19 1514)    ED Course  I have reviewed the triage vital signs and the nursing notes.  Pertinent labs & imaging results that were available during my care of the patient were reviewed by me and considered in my medical decision making (see chart for details).    MDM Rules/Calculators/A&P                      Tashya Patch is a 49 year old female with history of hypertension, mitral valve prolapse who presents to the ED with chest pain.  Patient states that she noticed some chest pain last night and kind of bothered her throughout the night.  She woke up and continued to have some chest discomfort that went to her back into her jaw but was worse when she took a deep breath.  She started to have increasing pain at work and decided to come for evaluation.  EKG shows sinus rhythm.  No ischemic changes, no ST elevation.  However while patient was in triage and given lab work her troponin came back elevated at 1461.  Upon my evaluation patient does not have active chest pain.  Repeat troponin was ordered and was stable.  Patient was given aspirin, morphine started on IV heparin bolus and infusion for possible NSTEMI.  She does not have significant cardiac history in the family.  She had good pulses throughout.  History and physical does not appear consistent with a dissection.  However given that she has some mild tachycardia and some pleuritic type chest pain and pain worse with deep breath I have ordered a CT scan of her chest to evaluate for PE.  Patient denies any recent illness.  She is currently enrolled in a coronavirus study and had a negative test last week.  This seems less likely to be myocarditis or pericarditis.  She does not have any signs of volume overload on exam.  Creatinine mildly elevated.  Overall chest x-ray showed no signs of infection.  If patient has PE anticipate likely medicine admit however if CT  scan of the chest is unremarkable anticipate admission to cardiology.  I assume  that CT scan will get good look to evaluate for dissection as well. Currently patient is chest pain-free and dissection seems less likely.  Patient was signed out to oncoming ED staff with patient pending remaining work-up.  This chart was dictated using voice recognition software.  Despite best efforts to proofread,  errors can occur which can change the documentation meaning.    Final Clinical Impression(s) / ED Diagnoses Final diagnoses:  None    Rx / DC Orders ED Discharge Orders    None       Lennice Sites, DO 08/04/19 [place], [place], DO 08/04/19 [place], [place], DO 08/04/19 1815

## 2019-08-04 NOTE — Progress Notes (Signed)
Upon ariving to this floor Pt bp 88/48,  Made Provider aware. Will continue to monitor.

## 2019-08-04 NOTE — Progress Notes (Addendum)
[place] for heparin Indication: chest pain/ACS  Allergies  Allergen Reactions  . Penicillins     Patient Measurements:   Heparin Dosing Weight: 66kg  Vital Signs: Temp: 97.9 F (36.6 C) (02/16 1210) Temp Source: Oral (02/16 1210) BP: 118/65 (02/16 1415) Pulse Rate: 98 (02/16 1415)  Labs: Recent Labs    08/04/19 1213  HGB 14.0  HCT 42.0  PLT 299  CREATININE 1.10*  TROPONINIHS 1,434*    CrCl cannot be calculated (Unknown ideal weight.).   Medical History: Past Medical History:  Diagnosis Date  . Hypertension   . Kidney stones    Assessment: 89 YOF presenting with CP/palpitations, elevated troponin. No anticoagulation PTA. Pharmacy consulted to dose heparin for NSTEMI. Initial heparin level therapeutic. CBC wnl. No active bleed issues documented.  Goal of Therapy:  Heparin level 0.3-0.7 units/ml Monitor platelets by anticoagulation protocol: Yes   Plan:  Continue heparin drip at 750 units/hr Confirmatory heparin level with AM labs Monitor daily CBC, s/sx bleeding Cath planned for 2/17 AM   Elicia Lamp, PharmD, BCPS Please check AMION for all [place] contact numbers Clinical Pharmacist 08/04/2019 10:07 PM

## 2019-08-04 NOTE — ED Triage Notes (Signed)
Pt reports chest pain that started last night along with palpitations and feeling lightheaded. Pt took tums at home without relief. Pt went to work and had worsening symptoms. Hx of mital valve prolapse in 2019. Pt a.o.

## 2019-08-05 ENCOUNTER — Inpatient Hospital Stay (HOSPITAL_COMMUNITY): Payer: BC Managed Care – PPO

## 2019-08-05 ENCOUNTER — Inpatient Hospital Stay (HOSPITAL_COMMUNITY)
Admission: EM | Disposition: A | Discharge status: Home / Self Care | Payer: Self-pay | Source: Home / Self Care | Attending: Cardiovascular Disease

## 2019-08-05 ENCOUNTER — Encounter (HOSPITAL_COMMUNITY): Payer: Self-pay | Admitting: Cardiovascular Disease

## 2019-08-05 DIAGNOSIS — R079 Chest pain, unspecified: Secondary | ICD-10-CM

## 2019-08-05 HISTORY — PX: LEFT HEART CATH AND CORONARY ANGIOGRAPHY: CATH118249

## 2019-08-05 LAB — CBC WITH DIFFERENTIAL/PLATELET
Abs Immature Granulocytes: 0.02 10*3/uL (ref 0.00–0.07)
Basophils Absolute: 0 10*3/uL (ref 0.0–0.1)
Basophils Relative: 0 %
Eosinophils Absolute: 0 10*3/uL (ref 0.0–0.5)
Eosinophils Relative: 1 %
HCT: 38.4 % (ref 36.0–46.0)
Hemoglobin: 13.1 g/dL (ref 12.0–15.0)
Immature Granulocytes: 0 %
Lymphocytes Relative: 37 %
Lymphs Abs: 3 10*3/uL (ref 0.7–4.0)
MCH: 30.6 pg (ref 26.0–34.0)
MCHC: 34.1 g/dL (ref 30.0–36.0)
MCV: 89.7 fL (ref 80.0–100.0)
Monocytes Absolute: 0.5 10*3/uL (ref 0.1–1.0)
Monocytes Relative: 6 %
Neutro Abs: 4.5 10*3/uL (ref 1.7–7.7)
Neutrophils Relative %: 56 %
Platelets: 246 10*3/uL (ref 150–400)
RBC: 4.28 MIL/uL (ref 3.87–5.11)
RDW: 12.6 % (ref 11.5–15.5)
WBC: 8.1 10*3/uL (ref 4.0–10.5)
nRBC: 0 % (ref 0.0–0.2)

## 2019-08-05 LAB — BASIC METABOLIC PANEL
Anion gap: 9 (ref 5–15)
BUN: 13 mg/dL (ref 6–20)
CO2: 22 mmol/L (ref 22–32)
Calcium: 8.7 mg/dL — ABNORMAL LOW (ref 8.9–10.3)
Chloride: 106 mmol/L (ref 98–111)
Creatinine, Ser: 1.03 mg/dL — ABNORMAL HIGH (ref 0.44–1.00)
GFR calc Af Amer: >60 mL/min (ref >60)
GFR calc non Af Amer: >60 mL/min (ref >60)
Glucose, Bld: 117 mg/dL — ABNORMAL HIGH (ref 70–99)
Potassium: 3.7 mmol/L (ref 3.5–5.1)
Sodium: 137 mmol/L (ref 135–145)

## 2019-08-05 LAB — LIPID PANEL
Cholesterol: 213 mg/dL — ABNORMAL HIGH (ref 0–200)
HDL: 68 mg/dL (ref >40)
LDL Cholesterol: 121 mg/dL — ABNORMAL HIGH (ref 0–99)
Total CHOL/HDL Ratio: 3.1 RATIO
Triglycerides: 121 mg/dL (ref <150)
VLDL: 24 mg/dL (ref 0–40)

## 2019-08-05 LAB — ECHOCARDIOGRAM COMPLETE
Height: 65 in
Weight: 2276.8 oz

## 2019-08-05 LAB — HEPARIN LEVEL (UNFRACTIONATED): Heparin Unfractionated: 0.37 IU/mL (ref 0.30–0.70)

## 2019-08-05 SURGERY — LEFT HEART CATH AND CORONARY ANGIOGRAPHY
Anesthesia: LOCAL

## 2019-08-05 MED ORDER — HEPARIN (PORCINE) IN NACL 1000-0.9 UT/500ML-% IV SOLN
INTRAVENOUS | Status: AC
  Filled 2019-08-05: qty 500

## 2019-08-05 MED ORDER — IOHEXOL 350 MG/ML SOLN
INTRAVENOUS | Status: DC | PRN
  Administered 2019-08-05: 50 mL

## 2019-08-05 MED ORDER — SODIUM CHLORIDE 0.9 % WEIGHT BASED INFUSION
3.0000 mL/kg/h | INTRAVENOUS | Status: DC
  Administered 2019-08-05: 05:00:00 3 mL/kg/h via INTRAVENOUS

## 2019-08-05 MED ORDER — SODIUM CHLORIDE 0.9 % WEIGHT BASED INFUSION
1.0000 mL/kg/h | INTRAVENOUS | Status: DC
  Administered 2019-08-05: 1 mL/kg/h via INTRAVENOUS

## 2019-08-05 MED ORDER — MIDAZOLAM HCL 2 MG/2ML IJ SOLN
INTRAMUSCULAR | Status: AC
  Filled 2019-08-05: qty 2

## 2019-08-05 MED ORDER — SODIUM CHLORIDE 0.9% FLUSH: 3.0000 mL | INTRAVENOUS | Status: DC | PRN

## 2019-08-05 MED ORDER — MIDAZOLAM HCL 2 MG/2ML IJ SOLN
INTRAMUSCULAR | Status: DC | PRN
  Administered 2019-08-05 (×2): 1 mg via INTRAVENOUS

## 2019-08-05 MED ORDER — METOPROLOL TARTRATE 12.5 MG HALF TABLET
12.5000 mg | ORAL_TABLET | Freq: Two times a day (BID) | ORAL | Status: DC
  Administered 2019-08-05 (×2): 12.5 mg via ORAL
  Filled 2019-08-05 (×2): qty 1

## 2019-08-05 MED ORDER — SODIUM CHLORIDE 0.9 % IV SOLN: 250.0000 mL | INTRAVENOUS | Status: DC | PRN

## 2019-08-05 MED ORDER — FENTANYL CITRATE (PF) 100 MCG/2ML IJ SOLN
INTRAMUSCULAR | Status: DC | PRN
  Administered 2019-08-05: 25 ug via INTRAVENOUS
  Administered 2019-08-05: 50 ug via INTRAVENOUS

## 2019-08-05 MED ORDER — SODIUM CHLORIDE 0.9 % WEIGHT BASED INFUSION: 1.0000 mL/kg/h | INTRAVENOUS | Status: DC

## 2019-08-05 MED ORDER — HEPARIN SODIUM (PORCINE) 1000 UNIT/ML IJ SOLN
INTRAMUSCULAR | Status: AC
  Filled 2019-08-05: qty 1

## 2019-08-05 MED ORDER — SODIUM CHLORIDE 0.9 % IV SOLN
INTRAVENOUS | Status: AC | PRN
  Administered 2019-08-05: 999 mL/h via INTRAVENOUS

## 2019-08-05 MED ORDER — ASPIRIN 81 MG PO CHEW: 81.0000 mg | CHEWABLE_TABLET | ORAL | Status: DC

## 2019-08-05 MED ORDER — ASPIRIN 81 MG PO CHEW
81.0000 mg | CHEWABLE_TABLET | ORAL | Status: AC
  Administered 2019-08-05: 81 mg via ORAL
  Filled 2019-08-05: qty 1

## 2019-08-05 MED ORDER — SODIUM CHLORIDE 0.9% FLUSH
3.0000 mL | Freq: Two times a day (BID) | INTRAVENOUS | Status: DC
  Administered 2019-08-05 – 2019-08-06 (×2): 3 mL via INTRAVENOUS

## 2019-08-05 MED ORDER — HEPARIN (PORCINE) IN NACL 1000-0.9 UT/500ML-% IV SOLN
INTRAVENOUS | Status: DC | PRN
  Administered 2019-08-05 (×3): 500 mL

## 2019-08-05 MED ORDER — HEPARIN (PORCINE) IN NACL 2000-0.9 UNIT/L-% IV SOLN
INTRAVENOUS | Status: AC
  Filled 2019-08-05: qty 1000

## 2019-08-05 MED ORDER — LISINOPRIL 10 MG PO TABS
10.0000 mg | ORAL_TABLET | Freq: Every day | ORAL | Status: DC
  Administered 2019-08-06: 09:00:00 10 mg via ORAL
  Filled 2019-08-05: qty 1

## 2019-08-05 MED ORDER — SODIUM CHLORIDE 0.9 % WEIGHT BASED INFUSION: 3.0000 mL/kg/h | INTRAVENOUS | Status: DC

## 2019-08-05 MED ORDER — VERAPAMIL HCL 2.5 MG/ML IV SOLN
INTRAVENOUS | Status: AC
  Filled 2019-08-05: qty 2

## 2019-08-05 MED ORDER — LIDOCAINE HCL (PF) 1 % IJ SOLN
INTRAMUSCULAR | Status: DC | PRN
  Administered 2019-08-05: 5 mL via INTRADERMAL
  Administered 2019-08-05: 15 mL via INTRADERMAL

## 2019-08-05 MED ORDER — FENTANYL CITRATE (PF) 100 MCG/2ML IJ SOLN
INTRAMUSCULAR | Status: AC
  Filled 2019-08-05: qty 2

## 2019-08-05 MED ORDER — SODIUM CHLORIDE 0.9% FLUSH: 3.0000 mL | Freq: Two times a day (BID) | INTRAVENOUS | Status: DC

## 2019-08-05 MED ORDER — SODIUM CHLORIDE 0.9 % IV SOLN: INTRAVENOUS | Status: AC

## 2019-08-05 MED ORDER — LIDOCAINE HCL (PF) 1 % IJ SOLN
INTRAMUSCULAR | Status: AC
  Filled 2019-08-05: qty 30

## 2019-08-05 SURGICAL SUPPLY — 13 items
CATH INFINITI 5FR MULTPACK ANG (CATHETERS) ×1 IMPLANT
CLOSURE MYNX CONTROL 5F (Vascular Products) ×1 IMPLANT
GLIDESHEATH SLEND SS 6F .021 (SHEATH) ×1 IMPLANT
GUIDEWIRE INQWIRE 1.5J.035X260 (WIRE) IMPLANT
INQWIRE 1.5J .035X260CM (WIRE) ×2
KIT HEART LEFT (KITS) ×2 IMPLANT
KIT MICROPUNCTURE NIT STIFF (SHEATH) ×1 IMPLANT
PACK CARDIAC CATHETERIZATION (CUSTOM PROCEDURE TRAY) ×2 IMPLANT
SHEATH PINNACLE 5F 10CM (SHEATH) ×1 IMPLANT
SHEATH PROBE COVER 6X72 (BAG) ×2 IMPLANT
TRANSDUCER W/STOPCOCK (MISCELLANEOUS) ×2 IMPLANT
TUBING CIL FLEX 10 FLL-RA (TUBING) ×2 IMPLANT
WIRE EMERALD 3MM-J .035X150CM (WIRE) ×1 IMPLANT

## 2019-08-05 NOTE — Plan of Care (Signed)

## 2019-08-05 NOTE — Progress Notes (Signed)
Progress Note  Patient Name: Kylie Rosario Date of Encounter: 08/05/2019  Primary Cardiologist: Lauree Chandler, MD   Subjective   Mild chest pressure this am.   Inpatient Medications    Scheduled Meds: . aspirin EC  81 mg Oral Daily  . buPROPion  150 mg Oral Daily  . lisinopril  10 mg Oral Daily  . sodium chloride flush  3 mL Intravenous Q12H   Continuous Infusions: . sodium chloride    . sodium chloride 1 mL/kg/hr (08/05/19 0604)  . heparin 750 Units/hr (08/04/19 1453)   PRN Meds: sodium chloride, acetaminophen, nitroGLYCERIN, ondansetron (ZOFRAN) IV, sodium chloride flush   Vital Signs    Vitals:   08/04/19 2300 08/05/19 0300 08/05/19 0600 08/05/19 0623  BP: (!) 89/55  (!) 94/53   Pulse:   78   Resp:   18   Temp:   98.3 F (36.8 C)   TempSrc:      SpO2:   100%   Weight: 66.5 kg   64.5 kg  Height:  5\' 5"  (1.651 m)     No intake or output data in the 24 hours ending 08/05/19 0752 Last 3 Weights 08/05/2019 08/04/2019 11/15/2014  Weight (lbs) 142 lb 4.8 oz 146 lb 9.7 oz 130 lb  Weight (kg) 64.547 kg 66.5 kg 58.968 kg      Telemetry    Sinus- Personally Reviewed  ECG    Sinus, inferolateral T wave inversion - Personally Reviewed  Physical Exam   GEN: No acute distress.   Neck: No JVD Cardiac: RRR, no murmurs, rubs, or gallops.  Respiratory: Clear to auscultation bilaterally. GI: Soft, nontender, non-distended  MS: No edema; No deformity. Neuro:  Nonfocal  Psych: Normal affect   Labs    High Sensitivity Troponin:   Recent Labs  Lab 08/04/19 1213 08/04/19 1425  TROPONINIHS 1,434* 1,461*      Chemistry Recent Labs  Lab 08/04/19 1213 08/05/19 0526  NA 138 137  K 4.0 3.7  CL 104 106  CO2 23 22  GLUCOSE 109* 117*  BUN 14 13  CREATININE 1.10* 1.03*  CALCIUM 10.0 8.7*  GFRNONAA 58* >60  GFRAA >60 >60  ANIONGAP 11 9     Hematology Recent Labs  Lab 08/04/19 1213 08/05/19 0526  WBC 11.4* 8.1  RBC 4.64 4.28  HGB 14.0 13.1   HCT 42.0 38.4  MCV 90.5 89.7  MCH 30.2 30.6  MCHC 33.3 34.1  RDW 12.4 12.6  PLT 299 246    BNPNo results for input(s): BNP, PROBNP in the last 168 hours.   DDimer No results for input(s): DDIMER in the last 168 hours.   Radiology    DG Chest 2 View  Result Date: 08/04/2019 CLINICAL DATA:  Chest pain EXAM: CHEST - 2 VIEW COMPARISON:  None. FINDINGS: The heart size and mediastinal contours are within normal limits. Both lungs are clear. The visualized skeletal structures are unremarkable. IMPRESSION: No active cardiopulmonary disease. Electronically Signed   By: Franchot Gallo M.D.   On: 08/04/2019 12:37   CT Angio Chest PE W and/or Wo Contrast  Result Date: 08/04/2019 CLINICAL DATA:  Shortness of breath, chest pain EXAM: CT ANGIOGRAPHY CHEST WITH CONTRAST TECHNIQUE: Multidetector CT imaging of the chest was performed using the standard protocol during bolus administration of intravenous contrast. Multiplanar CT image reconstructions and MIPs were obtained to evaluate the vascular anatomy. CONTRAST:  169mL OMNIPAQUE IOHEXOL 350 MG/ML SOLN COMPARISON:  Same day chest x-ray FINDINGS: Cardiovascular: Satisfactory opacification of  the pulmonary arteries to the segmental level. No evidence of pulmonary embolism. Normal heart size. No pericardial effusion. Thoracic aorta is nonaneurysmal. Mediastinum/Nodes: No enlarged mediastinal, hilar, or axillary lymph nodes. Thyroid gland, trachea, and esophagus demonstrate no significant findings. Lungs/Pleura: Minimal bibasilar dependent subsegmental atelectasis. Calcified granuloma within the posterior left lung base. No focal airspace consolidation, pleural effusion, or pneumothorax. Upper Abdomen: No acute abnormality. Musculoskeletal: No chest wall abnormality. No acute or significant osseous findings. Bilateral breast prostheses. Review of the MIP images confirms the above findings. IMPRESSION: Negative for pulmonary embolism or other acute intrathoracic  abnormality. Electronically Signed   By: Davina Poke D.O.   On: 08/04/2019 17:03    Cardiac Studies     Patient Profile     49 y.o. female with history of HTN, newly diagnosed hyperlipidemia admitted with chest pain and elevated troponin consistent with acute coronary syndrome.   Assessment & Plan    1. NSTEMI: Clinical presentation worrisome for acute coronary syndrome. Given her overall good health and young age with only risk factors for CAD being HTN and HLD, concern for SCAD vs stress induced cardiomyopathy. Echo at bedside this am. Plan for cath at 8:30 am with Dr. Fletcher Anon. Continue ASA and IV heparin. Will start a statin post cath. All questions answered this am.   For questions or updates, please contact [place] Please consult [email or web address] for contact info under       Signed, Lauree Chandler, MD  08/05/2019, 7:52 AM

## 2019-08-05 NOTE — Interval H&P Note (Signed)
Cath Lab Visit (complete for each Cath Lab visit)  Clinical Evaluation Leading to the Procedure:   ACS: Yes.    Non-ACS:  n/a   History and Physical Interval Note:  08/05/2019 8:51 AM  Kylie Rosario  has presented today for surgery, with the diagnosis of NSTEMI.  The various methods of treatment have been discussed with the patient and family. After consideration of risks, benefits and other options for treatment, the patient has consented to  Procedure(s): LEFT HEART CATH AND CORONARY ANGIOGRAPHY (N/A) as a surgical intervention.  The patient's history has been reviewed, patient examined, no change in status, stable for surgery.  I have reviewed the patient's chart and labs.  Questions were answered to the patient's satisfaction.     Kathlyn [place]

## 2019-08-05 NOTE — Progress Notes (Signed)
  Echocardiogram 2D Echocardiogram has been performed.  Kylie Rosario [name] 08/05/2019, 8:19 AM

## 2019-08-05 NOTE — H&P (View-Only) (Signed)
Progress Note  Patient Name: Kylie Rosario Date of Encounter: 08/05/2019  Primary Cardiologist: Lauree Chandler, MD   Subjective   Mild chest pressure this am.   Inpatient Medications    Scheduled Meds: . aspirin EC  81 mg Oral Daily  . buPROPion  150 mg Oral Daily  . lisinopril  10 mg Oral Daily  . sodium chloride flush  3 mL Intravenous Q12H   Continuous Infusions: . sodium chloride    . sodium chloride 1 mL/kg/hr (08/05/19 0604)  . heparin 750 Units/hr (08/04/19 1453)   PRN Meds: sodium chloride, acetaminophen, nitroGLYCERIN, ondansetron (ZOFRAN) IV, sodium chloride flush   Vital Signs    Vitals:   08/04/19 2300 08/05/19 0300 08/05/19 0600 08/05/19 0623  BP: (!) 89/55  (!) 94/53   Pulse:   78   Resp:   18   Temp:   98.3 F (36.8 C)   TempSrc:      SpO2:   100%   Weight: 66.5 kg   64.5 kg  Height:  5\' 5"  (1.651 m)     No intake or output data in the 24 hours ending 08/05/19 0752 Last 3 Weights 08/05/2019 08/04/2019 11/15/2014  Weight (lbs) 142 lb 4.8 oz 146 lb 9.7 oz 130 lb  Weight (kg) 64.547 kg 66.5 kg 58.968 kg      Telemetry    Sinus- Personally Reviewed  ECG    Sinus, inferolateral T wave inversion - Personally Reviewed  Physical Exam   GEN: No acute distress.   Neck: No JVD Cardiac: RRR, no murmurs, rubs, or gallops.  Respiratory: Clear to auscultation bilaterally. GI: Soft, nontender, non-distended  MS: No edema; No deformity. Neuro:  Nonfocal  Psych: Normal affect   Labs    High Sensitivity Troponin:   Recent Labs  Lab 08/04/19 1213 08/04/19 1425  TROPONINIHS 1,434* 1,461*      Chemistry Recent Labs  Lab 08/04/19 1213 08/05/19 0526  NA 138 137  K 4.0 3.7  CL 104 106  CO2 23 22  GLUCOSE 109* 117*  BUN 14 13  CREATININE 1.10* 1.03*  CALCIUM 10.0 8.7*  GFRNONAA 58* >60  GFRAA >60 >60  ANIONGAP 11 9     Hematology Recent Labs  Lab 08/04/19 1213 08/05/19 0526  WBC 11.4* 8.1  RBC 4.64 4.28  HGB 14.0 13.1    HCT 42.0 38.4  MCV 90.5 89.7  MCH 30.2 30.6  MCHC 33.3 34.1  RDW 12.4 12.6  PLT 299 246    BNPNo results for input(s): BNP, PROBNP in the last 168 hours.   DDimer No results for input(s): DDIMER in the last 168 hours.   Radiology    DG Chest 2 View  Result Date: 08/04/2019 CLINICAL DATA:  Chest pain EXAM: CHEST - 2 VIEW COMPARISON:  None. FINDINGS: The heart size and mediastinal contours are within normal limits. Both lungs are clear. The visualized skeletal structures are unremarkable. IMPRESSION: No active cardiopulmonary disease. Electronically Signed   By: Franchot Gallo M.D.   On: 08/04/2019 12:37   CT Angio Chest PE W and/or Wo Contrast  Result Date: 08/04/2019 CLINICAL DATA:  Shortness of breath, chest pain EXAM: CT ANGIOGRAPHY CHEST WITH CONTRAST TECHNIQUE: Multidetector CT imaging of the chest was performed using the standard protocol during bolus administration of intravenous contrast. Multiplanar CT image reconstructions and MIPs were obtained to evaluate the vascular anatomy. CONTRAST:  164mL OMNIPAQUE IOHEXOL 350 MG/ML SOLN COMPARISON:  Same day chest x-ray FINDINGS: Cardiovascular: Satisfactory opacification  of the pulmonary arteries to the segmental level. No evidence of pulmonary embolism. Normal heart size. No pericardial effusion. Thoracic aorta is nonaneurysmal. Mediastinum/Nodes: No enlarged mediastinal, hilar, or axillary lymph nodes. Thyroid gland, trachea, and esophagus demonstrate no significant findings. Lungs/Pleura: Minimal bibasilar dependent subsegmental atelectasis. Calcified granuloma within the posterior left lung base. No focal airspace consolidation, pleural effusion, or pneumothorax. Upper Abdomen: No acute abnormality. Musculoskeletal: No chest wall abnormality. No acute or significant osseous findings. Bilateral breast prostheses. Review of the MIP images confirms the above findings. IMPRESSION: Negative for pulmonary embolism or other acute intrathoracic  abnormality. Electronically Signed   By: Davina Poke D.O.   On: 08/04/2019 17:03    Cardiac Studies     Patient Profile     49 y.o. female with history of HTN, newly diagnosed hyperlipidemia admitted with chest pain and elevated troponin consistent with acute coronary syndrome.   Assessment & Plan    1. NSTEMI: Clinical presentation worrisome for acute coronary syndrome. Given her overall good health and young age with only risk factors for CAD being HTN and HLD, concern for SCAD vs stress induced cardiomyopathy. Echo at bedside this am. Plan for cath at 8:30 am with Dr. Fletcher Anon. Continue ASA and IV heparin. Will start a statin post cath. All questions answered this am.   For questions or updates, please contact [place] Please consult [email or web address] for contact info under       Signed, Lauree Chandler, MD  08/05/2019, 7:52 AM

## 2019-08-05 NOTE — Progress Notes (Signed)
[place] for heparin Indication: chest pain/ACS  Allergies  Allergen Reactions  . Penicillins Hives    Did it involve swelling of the face/tongue/throat, SOB, or low BP? No Did it involve sudden or severe rash/hives, skin peeling, or any reaction on the inside of your mouth or nose? Unknown Did you need to seek medical attention at a hospital or doctor's office? No When did it last happen?Pt said she was very young If all above answers are "NO", may proceed with cephalosporin use.     Patient Measurements: Height: 5\' 5"  (165.1 cm) Weight: 142 lb 4.8 oz (64.5 kg) IBW/kg (Calculated) : 57 Heparin Dosing Weight: 66kg  Vital Signs: Temp: 98.3 F (36.8 C) (02/17 0600) Temp Source: Oral (02/16 2103) BP: 94/53 (02/17 0600) Pulse Rate: 78 (02/17 0600)  Labs: Recent Labs    08/04/19 1213 08/04/19 1425 08/04/19 2117 08/05/19 0526  HGB 14.0  --   --  13.1  HCT 42.0  --   --  38.4  PLT 299  --   --  246  HEPARINUNFRC  --   --  0.49 0.37  CREATININE 1.10*  --   --  1.03*  TROPONINIHS 1,434* 1,461*  --   --     Estimated Creatinine Clearance: 58.8 mL/min (A) (by C-G formula based on SCr of 1.03 mg/dL (H)).   Medical History: Past Medical History:  Diagnosis Date  . Cervical cancer ([place])   . Hypertension   . Kidney stones   . Migraines    Assessment: 1 YOF presenting with CP/palpitations, elevated troponin. No anticoagulation PTA. Pharmacy consulted to dose heparin for NSTEMI.  Heparin level remains at goal this am. CBC wnl. No active bleeding issues documented.  Goal of Therapy:  Heparin level 0.3-0.7 units/ml Monitor platelets by anticoagulation protocol: Yes   Plan:  Continue heparin drip at 750 units/hr Monitor daily CBC, s/sx bleeding Cath planned for today 2/17  Erin Hearing PharmD., BCPS Clinical Pharmacist 08/05/2019 7:51 AM

## 2019-08-05 NOTE — Progress Notes (Signed)
BP was 89/55 after receiving 547ml bolus per order. Made provider aware Will continue to monitor

## 2019-08-06 ENCOUNTER — Telehealth: Payer: Self-pay | Admitting: Physician Assistant

## 2019-08-06 DIAGNOSIS — I5181 Takotsubo syndrome: Principal | ICD-10-CM

## 2019-08-06 LAB — BASIC METABOLIC PANEL
Anion gap: 10 (ref 5–15)
BUN: 13 mg/dL (ref 6–20)
CO2: 23 mmol/L (ref 22–32)
Calcium: 8.8 mg/dL — ABNORMAL LOW (ref 8.9–10.3)
Chloride: 106 mmol/L (ref 98–111)
Creatinine, Ser: 0.99 mg/dL (ref 0.44–1.00)
GFR calc Af Amer: >60 mL/min (ref >60)
GFR calc non Af Amer: >60 mL/min (ref >60)
Glucose, Bld: 103 mg/dL — ABNORMAL HIGH (ref 70–99)
Potassium: 4 mmol/L (ref 3.5–5.1)
Sodium: 139 mmol/L (ref 135–145)

## 2019-08-06 MED ORDER — METOPROLOL SUCCINATE ER 25 MG PO TB24
12.5000 mg | ORAL_TABLET | Freq: Every day | ORAL | Status: DC
  Administered 2019-08-06: 12.5 mg via ORAL
  Filled 2019-08-06: qty 1

## 2019-08-06 MED ORDER — METOPROLOL SUCCINATE ER 25 MG PO TB24: 12.5000 mg | ORAL_TABLET | Freq: Every day | ORAL | 11 refills | Status: DC

## 2019-08-06 MED FILL — METOPROLOL SUCCINATE ER 25: 25 | 30 days supply | Qty: 15 | Fill #0

## 2019-08-06 NOTE — Plan of Care (Signed)

## 2019-08-06 NOTE — Telephone Encounter (Signed)
**Note De-Identified Kylie Rosario Obfuscation** The pt is being discharged from [facility] today. We will call her tomorrow.

## 2019-08-06 NOTE — Discharge Summary (Addendum)
Discharge Summary    Patient ID: Kylie Rosario MRN: [id number]; DOB: January 22, 1971  Admit date: 08/04/2019 Discharge date: 08/06/2019  Primary Care Provider: [place], Julie M, NP  Primary Cardiologist: Lauree Chandler, MD   Discharge Diagnoses    Active Problems:   NSTEMI (non-ST elevated myocardial infarction) [facility])   Takotsubo stress-induced cardiomyopathy   HTN   HLD  Diagnostic Studies/Procedures    Echo 08/05/19 1. Severely reduced LV EF with preserved to hyperdynamic basal function  and hypokinesis moving distally, to akinetic apex. This is consistent with  takotsubo cardiomyopathy. No LV thrombus visualized at the apex, though no  echo contrast used on this  study.  2. LVOT gradient 23 mmHg. Left ventricular ejection fraction, by  estimation, is 30 to 35%. The left ventricle has severely decreased  function. The left ventricle demonstrates regional wall motion  abnormalities (see scoring diagram/findings for  description). Indeterminate diastolic filling due to E-A fusion.  3. Right ventricular systolic function is normal. The right ventricular  size is normal. There is mildly elevated pulmonary artery systolic  pressure.  4. Mild systolic anterior motion of the MV anterior leaflet due to  hyperdynamic basal septal contraction.. The mitral valve is normal in  structure and function. Trivial mitral valve regurgitation. No evidence of  mitral stenosis.  5. The aortic valve is normal in structure and function. Aortic valve  regurgitation is not visualized. No aortic stenosis is present.  6. The inferior vena cava is normal in size with greater than 50%  respiratory variability, suggesting right atrial pressure of 3 mmHg.    LEFT HEART CATH AND CORONARY ANGIOGRAPHY 08/05/19  Conclusion  1.  Normal coronary arteries.  Mild ostial RCA narrowing thought to be due to catheter induced spasm. 2.  Left ventricular angiography was not performed.  Echo showed  moderately to severely reduced LV systolic function with wall motion abnormality consistent with stress-induced cardiomyopathy. 3.  Mildly elevated left ventricular end-diastolic pressure and mild LVOT gradient noted with pullback (25 mm systolic gradient).  Recommendations: The patient's findings are consistent with Takotsubo stress-induced cardiomyopathy.  Recommend medical therapy.  I started small dose metoprolol.  She is already on a small dose lisinopril.      History of Present Illness     Kylie Rosario is a 49 y.o. female with history of HTN, cervical cancer, migraines and newly diagnosed hyperlipidemia admitted with chest pain.  Presented with CP radiated to her jaw and seemed to be worsened with inspiration. Initial troponin 1434-->1461.  In the ED, she was given morphine and started on IV heparin. She currently has no chest pain. She had an echo in 2019 at [facility] with LVEF=55-60%. 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 No aortic valve disease.  CTA chest with no PE and no evidence of aortic dissection. EKG with sinus rhythm, no ischemia abnormalities.   Hospital Course     Consultants: None  Her clinic presentation was worrisome for ACS.  Cath showed normal coronary arteries.  Mild ostial RCA narrowing thought to be due to catheter induced spasm. Echo showed reduced LVEF to 30-35% with WM abnormality. Her indings are consistent with Takotsubo stress-induced cardiomyopathy.  Recommend medical therapy. Added low dose BB. Already on Lisinopril. Reports "dog had big issue last week and emotionally stressed out". No recurrent chest pain. Up titrate heart failure therapy as outpatient.   The patient been seen by Dr. Angelena Form today and deemed ready for discharge home. All follow-up appointments have been  scheduled. Discharge medications are listed below.    Did the patient have an acute coronary syndrome (MI, NSTEMI, STEMI, etc) this admission?:  No.   The  elevated Troponin was due to the acute medical illness (demand ischemia).    Discharge Vitals Blood pressure (!) 113/49, pulse 66, temperature 97.9 F (36.6 C), temperature source Oral, resp. rate 18, height 5\' 5"  (1.651 m), weight 64.9 kg, SpO2 99 %.  Filed Weights   08/04/19 2300 08/05/19 0623 08/06/19 0500  Weight: 66.5 kg 64.5 kg 64.9 kg   Physical Exam  Constitutional: She is oriented to person, place, and time and well-developed, well-nourished, and in no distress.  HENT:  Head: Normocephalic and atraumatic.  Eyes: Pupils are equal, round, and reactive to light. Conjunctivae and EOM are normal.  Cardiovascular: Normal rate and regular rhythm.  Right groin cath site without hematoma   Pulmonary/Chest: Effort normal and breath sounds normal.  Abdominal: Soft. Bowel sounds are normal.  Musculoskeletal:        General: Normal range of motion.     Cervical back: Normal range of motion and neck supple.  Neurological: She is alert and oriented to person, place, and time.  Skin: Skin is warm and dry.  Psychiatric: Affect normal.   Labs & Radiologic Studies    CBC Recent Labs    08/04/19 1213 08/05/19 0526  WBC 11.4* 8.1  NEUTROABS  --  4.5  HGB 14.0 13.1  HCT 42.0 38.4  MCV 90.5 89.7  PLT 299 0000000   Basic Metabolic Panel Recent Labs    08/05/19 0526 08/06/19 0405  NA 137 139  K 3.7 4.0  CL 106 106  CO2 22 23  GLUCOSE 117* 103*  BUN 13 13  CREATININE 1.03* 0.99  CALCIUM 8.7* 8.8*   High Sensitivity Troponin:   Recent Labs  Lab 08/04/19 1213 08/04/19 1425  TROPONINIHS 1,434* 1,461*    Fasting Lipid Panel Recent Labs    08/05/19 0526  CHOL 213*  HDL 68  LDLCALC 121*  TRIG 121  CHOLHDL 3.1  _____________  DG Chest 2 View  Result Date: 08/04/2019 CLINICAL DATA:  Chest pain EXAM: CHEST - 2 VIEW COMPARISON:  None. FINDINGS: The heart size and mediastinal contours are within normal limits. Both lungs are clear. The visualized skeletal structures are  unremarkable. IMPRESSION: No active cardiopulmonary disease. Electronically Signed   By: Franchot Gallo M.D.   On: 08/04/2019 12:37   CT Angio Chest PE W and/or Wo Contrast  Result Date: 08/04/2019 CLINICAL DATA:  Shortness of breath, chest pain EXAM: CT ANGIOGRAPHY CHEST WITH CONTRAST TECHNIQUE: Multidetector CT imaging of the chest was performed using the standard protocol during bolus administration of intravenous contrast. Multiplanar CT image reconstructions and MIPs were obtained to evaluate the vascular anatomy. CONTRAST:  191mL OMNIPAQUE IOHEXOL 350 MG/ML SOLN COMPARISON:  Same day chest x-ray FINDINGS: Cardiovascular: Satisfactory opacification of the pulmonary arteries to the segmental level. No evidence of pulmonary embolism. Normal heart size. No pericardial effusion. Thoracic aorta is nonaneurysmal. Mediastinum/Nodes: No enlarged mediastinal, hilar, or axillary lymph nodes. Thyroid gland, trachea, and esophagus demonstrate no significant findings. Lungs/Pleura: Minimal bibasilar dependent subsegmental atelectasis. Calcified granuloma within the posterior left lung base. No focal airspace consolidation, pleural effusion, or pneumothorax. Upper Abdomen: No acute abnormality. Musculoskeletal: No chest wall abnormality. No acute or significant osseous findings. Bilateral breast prostheses. Review of the MIP images confirms the above findings. IMPRESSION: Negative for pulmonary embolism or other acute intrathoracic abnormality. Electronically Signed  By: Davina Poke D.O.   On: 08/04/2019 17:03   CARDIAC CATHETERIZATION  Result Date: 08/05/2019 1.  Normal coronary arteries.  Mild ostial RCA narrowing thought to be due to catheter induced spasm. 2.  Left ventricular angiography was not performed.  Echo showed moderately to severely reduced LV systolic function with wall motion abnormality consistent with stress-induced cardiomyopathy. 3.  Mildly elevated left ventricular end-diastolic pressure  and mild LVOT gradient noted with pullback (25 mm systolic gradient). Recommendations: The patient's findings are consistent with Takotsubo stress-induced cardiomyopathy.  Recommend medical therapy.  I started small dose metoprolol.  She is already on a small dose lisinopril.  ECHOCARDIOGRAM COMPLETE  Result Date: 08/05/2019    ECHOCARDIOGRAM REPORT   Patient Name:   BAYLOR RANUM Date of Exam: 08/05/2019 Medical Rec #:  [id number]      Height:       65.0 in Accession #:    [id number]     Weight:       142.3 lb Date of Birth:  03-Dec-1970     BSA:          1.71 m Patient Age:    49 years       BP:           94/53 mmHg Patient Gender: F              HR:           78 bpm. Exam Location:  Inpatient Procedure: 2D Echo Indications:   Chest Pain 786.50 / R07.9  History:       Patient has no prior history of Echocardiogram examinations.                NSTEMI.  Sonographer:   Vikki Ports Turrentine Referring      [place]  1. Severely reduced LV EF with preserved to hyperdynamic basal function and hypokinesis moving distally, to akinetic apex. This is consistent with takotsubo cardiomyopathy. No LV thrombus visualized at the apex, though no echo contrast used on this study.  2. LVOT gradient 23 mmHg. Left ventricular ejection fraction, by estimation, is 30 to 35%. The left ventricle has severely decreased function. The left ventricle demonstrates regional wall motion abnormalities (see scoring diagram/findings for description). Indeterminate diastolic filling due to E-A fusion.  3. Right ventricular systolic function is normal. The right ventricular size is normal. There is mildly elevated pulmonary artery systolic pressure.  4. Mild systolic anterior motion of the MV anterior leaflet due to hyperdynamic basal septal contraction.. The mitral valve is normal in structure and function. Trivial mitral valve regurgitation. No evidence of mitral stenosis.  5. The aortic valve is normal in  structure and function. Aortic valve regurgitation is not visualized. No aortic stenosis is present.  6. The inferior vena cava is normal in size with greater than 50% respiratory variability, suggesting right atrial pressure of 3 mmHg. Comparison(s): No prior Echocardiogram. FINDINGS  Left Ventricle: LVOT gradient 23 mmHg. Left ventricular ejection fraction, by estimation, is 30 to 35%. The left ventricle has severely decreased function. The left ventricle demonstrates regional wall motion abnormalities. The left ventricular internal  cavity size was normal in size. There is no left ventricular hypertrophy. Indeterminate diastolic filling due to E-A fusion.  LV Wall Scoring: The mid and distal anterior septum and entire apex are akinetic. The mid inferolateral segment, mid anterolateral segment, mid inferoseptal segment, mid anterior segment, and mid inferior segment are hypokinetic. The basal anteroseptal segment, basal  inferolateral segment, basal anterolateral segment, basal anterior segment, basal inferior segment, and basal inferoseptal segment are normal. Right Ventricle: The right ventricular size is normal. No increase in right ventricular wall thickness. Right ventricular systolic function is normal. There is mildly elevated pulmonary artery systolic pressure. The tricuspid regurgitant velocity is 2.75  m/s, and with an assumed right atrial pressure of 3 mmHg, the estimated right ventricular systolic pressure is [id number] mmHg. Left Atrium: Left atrial size was normal in size. Right Atrium: Right atrial size was normal in size. Pericardium: There is no evidence of pericardial effusion. Mitral Valve: Mild systolic anterior motion of the MV anterior leaflet due to hyperdynamic basal septal contraction. The mitral valve is normal in structure and function. Trivial mitral valve regurgitation. No evidence of mitral valve stenosis. Tricuspid Valve: The tricuspid valve is normal in structure. Tricuspid valve  regurgitation is trivial. No evidence of tricuspid stenosis. Aortic Valve: The aortic valve is normal in structure and function. Aortic valve regurgitation is not visualized. No aortic stenosis is present. Aortic valve mean gradient measures 23.0 mmHg. Aortic valve peak gradient measures 46.8 mmHg. Aortic valve area, by VTI measures 0.89 cm. Pulmonic Valve: The pulmonic valve was grossly normal. Pulmonic valve regurgitation is not visualized. No evidence of pulmonic stenosis. Aorta: The aortic root, ascending aorta and aortic arch are all structurally normal, with no evidence of dilitation or obstruction. Pulmonary Artery: The pulmonary artery is not well seen. Venous: The inferior vena cava is normal in size with greater than 50% respiratory variability, suggesting right atrial pressure of 3 mmHg. IAS/Shunts: No atrial level shunt detected by color flow Doppler.  LEFT VENTRICLE PLAX 2D LVOT diam:     1.90 cm LV SV:         56.14 ml LVOT Area:     2.84 cm  RIGHT VENTRICLE RV S prime:     14.00 cm/s TAPSE (M-mode): 1.9 cm LEFT ATRIUM             Index       RIGHT ATRIUM          Index LA Vol (A2C):   58.8 ml 34.35 ml/m RA Area:     9.23 cm LA Vol (A4C):   43.3 ml 25.30 ml/m RA Volume:   18.70 ml 10.92 ml/m LA Biplane Vol: 51.8 ml 30.26 ml/m  AORTIC VALVE AV Area (Vmax):    1.08 cm AV Area (Vmean):   1.19 cm AV Area (VTI):     0.89 cm AV Vmax:           342.00 cm/s AV Vmean:          217.000 cm/s AV VTI:            0.630 m AV Peak Grad:      46.8 mmHg AV Mean Grad:      23.0 mmHg LVOT Vmax:         130.00 cm/s LVOT Vmean:        90.900 cm/s LVOT VTI:          0.198 m LVOT/AV VTI ratio: 0.31 MITRAL VALVE               TRICUSPID VALVE MV Area (PHT): 8.72 cm    TR Peak grad:   30.2 mmHg MV Decel Time: 87 msec     TR Vmax:        275.00 cm/s MV E velocity: 72.80 cm/s MV A velocity: 91.70 cm/s  SHUNTS MV E/A ratio:  0.79        Systemic VTI:  0.20 m                            Systemic Diam: 1.90 cm Buford Dresser MD Electronically signed by Buford Dresser MD Signature Date/Time: 08/05/2019/10:41:48 AM    Final    Disposition   Pt is being discharged home today in good condition.  Follow-up Plans & Appointments    Follow-up Information    [place], Crista Luria, [place] Follow up on 08/21/2019.   Specialty: Cardiology Why: @11 :15am for hospital follow  Contact information: [address] [place] [address] [phone]          Discharge Instructions    Diet - low sodium heart healthy   Complete by: As directed    Discharge instructions   Complete by: As directed    No driving for 48 hours. No lifting over 5 lbs for 1 week. No sexual activity for 1 week. You may return to work on 07/10/19. Keep procedure site clean & dry. If you notice increased pain, swelling, bleeding or pus, call/return!  You may shower, but no soaking baths/hot tubs/pools for 1 week.   Increase activity slowly   Complete by: As directed       Discharge Medications   Allergies as of 08/06/2019      Reactions   Penicillins Hives   Did it involve swelling of the face/tongue/throat, SOB, or low BP? No Did it involve sudden or severe rash/hives, skin peeling, or any reaction on the inside of your mouth or nose? Unknown Did you need to seek medical attention at a hospital or doctor's office? No When did it last happen?Pt said she was very young If all above answers are "NO", may proceed with cephalosporin use.      Medication List    TAKE these medications   buPROPion 150 MG 24 hr tablet Commonly known as: WELLBUTRIN XL Take 150 mg by mouth at bedtime.   lisinopril 10 MG tablet Commonly known as: ZESTRIL Take 10 mg by mouth daily.   metoprolol succinate 25 MG 24 hr tablet Commonly known as: TOPROL-XL Take 0.5 tablets (12.5 mg total) by mouth daily.        Outstanding Labs/Studies   None  Duration of Discharge Encounter   Greater than 30 minutes including physician  time.  SignedLeanor Kail, PA 08/06/2019, 8:13 AM   I have personally seen and examined this patient. I agree with the assessment and plan as outlined above.  She is doing well. No chest pain. Tolerating beta blocker and ace-inh. Will d/c home today. Follow up in the office in 2 weeks. Repeat echo 3 months.   Lauree Chandler 08/06/2019 10:17 AM

## 2019-08-06 NOTE — Telephone Encounter (Signed)
New Message  Patient has a TOC appt with Vin Bhagat on 08/21/19

## 2019-08-07 MED FILL — Verapamil HCl IV Soln 2.5 MG/ML: INTRAVENOUS | Qty: 2 | Status: AC

## 2019-08-07 NOTE — Telephone Encounter (Signed)
**Note De-Identified Cullin Dishman Obfuscation** Patient contacted regarding discharge from [facility] on 08/06/2019.  Patient understands to follow up with provider Robbie Lis, PA-c on 08/21/2019 at 11:15 at [facility] in [place],  [address]. Patient understands discharge instructions? Yes Patient understands medications and regiment? Yes Patient understands to bring all medications to this visit? Yes

## 2019-08-19 ENCOUNTER — Encounter: Payer: Self-pay | Admitting: Physician Assistant

## 2019-08-19 NOTE — Progress Notes (Signed)
Cardiology Office Note    Date:  08/21/2019   ID:  [name], DOB Oct 19, 1970, MRN [id number]  PCP:  Martinique, Julie M, NP  Cardiologist: Lauree Chandler, MD   Chief Complaint: Hospital follow up   History of Present Illness:   Kylie Rosario is a 49 y.o. 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  Admitted 07/2019 with non-STEMI and symptoms worrisome for unstable angina.  Cath showed normal coronaries. Mild ostial RCA narrowing thought to be due to catheter induced spasm. Echo showed reduced LVEF to 30-35% with WM abnormality. Her findings are consistent with Takotsubo stress-induced cardiomyopathy. Recommend medical therapy. Added low dose BB. Already on Lisinopril. Reports "dog had big issue last week and emotionally stressed out". No recurrent chest pain. Up titrate heart failure therapy as outpatient.   Here today for follow-up.  She denies any recent anginal or exertional symptoms, palpitations or arrhythmias, orthostatic symptoms, CVA or TIA-like symptoms.  States her primary issue at the moment is fatigue. Otherwise she denies any other issues.  States at the time of her symptoms she was having multiple issues with family, her pet being sick, and other stress at work.  Her PCP recently prescribed Lexapro, Xanax, and Wellbutrin for anxiety and depression issues. . Past Medical History:  Diagnosis Date  . Cervical cancer ([place])   . History of kidney stones   . Hyperlipidemia   . Hypertension   . Migraines   . Takotsubo cardiomyopathy 07/2019    Past Surgical History:  Procedure Laterality Date  . ABDOMINAL HYSTERECTOMY    . BREAST ENHANCEMENT SURGERY    . LEFT HEART CATH AND CORONARY ANGIOGRAPHY N/A 08/05/2019   Procedure: LEFT HEART CATH AND CORONARY ANGIOGRAPHY;  Surgeon: Wellington Hampshire, MD;  Location: [facility];  Service: Cardiovascular;  Laterality: N/A;     Current Medications: Prior to Admission medications   Medication Sig Start Date End Date Taking? Authorizing Provider  buPROPion (WELLBUTRIN XL) 150 MG 24 hr tablet Take 150 mg by mouth at bedtime. 07/14/19   [provider]  lisinopril (ZESTRIL) 10 MG tablet Take 10 mg by mouth daily. 07/14/19   [provider]  metoprolol succinate (TOPROL-XL) 25 MG 24 hr tablet Take 0.5 tablets (12.5 mg total) by mouth daily. 08/06/19   Leanor Kail, PA    Allergies:   Penicillins   Social History   Socioeconomic History  . Marital status: Married    Spouse name: Not on file  . Number of children: 1  . Years of education: Not on file  . Highest education level: Not on file  Occupational History  . Occupation: Retired Education officer, museum  Tobacco Use  . Smoking status: Never Smoker  . Smokeless tobacco: Never Used  Substance and Sexual Activity  . Alcohol use: No  . Drug use: No  . Sexual activity: Not on file  Other Topics Concern  . Not on file  Social History Narrative  . Not on file   Social Determinants of Health   Financial Resource Strain:   . Difficulty of Paying Living Expenses: Not on file  Food Insecurity:   . Worried About Charity fundraiser in the Last Year: Not on file  . Ran Out of Food in the Last Year: Not on file  Transportation Needs:   . Lack of Transportation (Medical): Not on file  . Lack of Transportation (Non-Medical): Not on file  Physical Activity:   .  Days of Exercise per Week: Not on file  . Minutes of Exercise per Session: Not on file  Stress:   . Feeling of Stress : Not on file  Social Connections:   . Frequency of Communication with Friends and Family: Not on file  . Frequency of Social Gatherings with Friends and Family: Not on file  . Attends Religious Services: Not on file  . Active Member of Clubs or Organizations: Not on file  . Attends Archivist Meetings: Not on file  . Marital Status: Not on file      Family History:  The patient's family history includes CVA in her mother; Diabetes in her father.   ROS:   Please see the history of present illness.    ROS All other systems reviewed and are negative.   PHYSICAL EXAM:   VS:  There were no vitals taken for this visit.   GEN: Well nourished, well developed, in no acute distress  HEENT: normal  Neck: no JVD, carotid bruits, or masses Cardiac: RRR; no murmurs, rubs, or gallops,no edema  Respiratory:  clear to auscultation bilaterally, normal work of breathing GI: soft, nontender, nondistended, + BS MS: no deformity or atrophy  Skin: warm and dry, no rash Neuro:  Alert and Oriented x 3, Strength and sensation are intact Psych: euthymic mood, full affect  Wt Readings from Last 3 Encounters:  08/06/19 143 lb 2 oz (64.9 kg)  11/15/14 130 lb (59 kg)  11/12/14 130 lb (59 kg)      Studies/Labs Reviewed:   EKG: one today  Recent Labs: 08/05/2019: Hemoglobin 13.1; Platelets 246 08/06/2019: BUN 13; Creatinine, Ser 0.99; Potassium 4.0; Sodium 139   Lipid Panel    Component Value Date/Time   CHOL 213 (H) 08/05/2019 0526   TRIG 121 08/05/2019 0526   HDL 68 08/05/2019 0526   CHOLHDL 3.1 08/05/2019 0526   VLDL 24 08/05/2019 0526   LDLCALC 121 (H) 08/05/2019 0526    Additional studies/ records that were reviewed today include:   Echo 08/05/19 1. Severely reduced LV EF with preserved to hyperdynamic basal function  and hypokinesis moving distally, to akinetic apex. This is consistent with  takotsubo cardiomyopathy. No LV thrombus visualized at the apex, though no  echo contrast used on this  study.  2. LVOT gradient 23 mmHg. Left ventricular ejection fraction, by  estimation, is 30 to 35%. The left ventricle has severely decreased  function. The left ventricle demonstrates regional wall motion  abnormalities (see scoring diagram/findings for  description). Indeterminate diastolic filling due to E-A fusion.  3. Right  ventricular systolic function is normal. The right ventricular  size is normal. There is mildly elevated pulmonary artery systolic  pressure.  4. Mild systolic anterior motion of the MV anterior leaflet due to  hyperdynamic basal septal contraction.. The mitral valve is normal in  structure and function. Trivial mitral valve regurgitation. No evidence of  mitral stenosis.  5. The aortic valve is normal in structure and function. Aortic valve  regurgitation is not visualized. No aortic stenosis is present.  6. The inferior vena cava is normal in size with greater than 50%  respiratory variability, suggesting right atrial pressure of 3 mmHg.   LEFT HEART CATH AND CORONARY ANGIOGRAPHY 08/05/19  Conclusion  1. Normal coronary arteries. Mild ostial RCA narrowing thought to be due to catheter induced spasm. 2. Left ventricular angiography was not performed. Echo showed moderately to severely reduced LV systolic function with wall motion abnormality consistent  with stress-induced cardiomyopathy. 3. Mildly elevated left ventricular end-diastolic pressure and mild LVOT gradient noted with pullback (25 mm systolic gradient).  Recommendations: The patient's findings are consistent with Takotsubo stress-induced cardiomyopathy. Recommend medical therapy. I started small dose metoprolol. She is already on a small dose lisinopril.        ASSESSMENT & PLAN:    1. NSTEMI (non-ST elevated myocardial infarction) ([place]) Presented to ED with c/o of CP radiating to jaw worse with inspiration, Trop [id number]. Catheterization showed normal coronary arteries.Echo showed Ef 30-35% with WMA's c/w Takotsubo's cardiomyopathy.  2. Takotsubo cardiomyopathy Recent discovery of Takotsubo's cardiomyopathy.  She has been started on Toprol-XL 12.5 mg p.o. daily.  Will need to follow-up in 3 to 4 months and make a decision as to when to perform a follow-up echo to evaluate EF. ContinueToprol 1 XL 12.5  daily  3. Essential hypertension Blood pressure today is within normal limits at 104/72.  She is taking lisinopril 10 mg daily.  Continue lisinopril as directed.  4. Mixed hyperlipidemia She has recently elevated LDL 121.  She is not on a statin medication..  We spoke regarding lipid levels and lifestyle modifications which may help.  Also discussed starting a statin medication.  Patient prefers to try lifestyle changes at the moment and may decide later to have a statin initiated if follow-up LDL continues to be elevated.    Medication Adjustments/Labs and Tests Ordered: Current medicines are reviewed at length with the patient today.  Concerns regarding medicines are outlined above.  Medication changes, Labs and Tests ordered today are listed in the Patient Instructions below. There are no Patient Instructions on file for this visit.   Signed, Verta Ellen, NP  08/21/2019 11:14 AM    [facility] [address], [place], [place]  [address] Phone: [phone]; Fax: [phone]

## 2019-08-21 ENCOUNTER — Ambulatory Visit: Payer: BC Managed Care – PPO | Admitting: Family Medicine

## 2019-08-21 ENCOUNTER — Encounter: Payer: Self-pay | Admitting: Family Medicine

## 2019-08-21 ENCOUNTER — Other Ambulatory Visit: Payer: Self-pay

## 2019-08-21 VITALS — BP 104/72 | HR 60 | Ht 65.0 in | Wt 139.8 lb

## 2019-08-21 DIAGNOSIS — E782 Mixed hyperlipidemia: Secondary | ICD-10-CM | POA: Diagnosis not present

## 2019-08-21 DIAGNOSIS — I214 Non-ST elevation (NSTEMI) myocardial infarction: Secondary | ICD-10-CM | POA: Diagnosis not present

## 2019-08-21 DIAGNOSIS — I5181 Takotsubo syndrome: Secondary | ICD-10-CM | POA: Diagnosis not present

## 2019-08-21 DIAGNOSIS — I10 Essential (primary) hypertension: Secondary | ICD-10-CM | POA: Diagnosis not present

## 2019-08-21 NOTE — Patient Instructions (Addendum)
Medication Instructions:   Your physician recommends that you continue on your current medications as directed. Please refer to the Current Medication list given to you today.  *If you need a refill on your cardiac medications before your next appointment, please call your pharmacy*   Lab Work: [place]   If you have labs (blood work) drawn today and your tests are completely normal, you will receive your results only by: Marland Kitchen MyChart Message (if you have MyChart) OR . A paper copy in the mail If you have any lab test that is abnormal or we need to change your treatment, we will call you to review the results.   Testing/Procedures: NONE ORDERED  TODAY   Follow-Up: At [facility], you and your health needs are our priority.  As part of our continuing mission to provide you with exceptional heart care, we have created designated Provider Care Teams.  These Care Teams include your primary Cardiologist (physician) and Advanced Practice Providers (APPs -  Physician Assistants and Nurse Practitioners) who all work together to provide you with the care you need, when you need it.  We recommend signing up for the patient portal called "MyChart".  Sign up information is provided on this After Visit Summary.  MyChart is used to connect with patients for Virtual Visits (Telemedicine).  Patients are able to view lab/test results, encounter notes, upcoming appointments, etc.  Non-urgent messages can be sent to your provider as well.   To learn more about what you can do with MyChart, go to [email or web address].    Your next appointment:    3 month(s) AS SCHEDULED   The format for your next appointment:    In Person  Provider:  WITH VIN PA-C     Other Instructions

## 2019-11-25 ENCOUNTER — Telehealth: Payer: Self-pay | Admitting: *Deleted

## 2019-11-25 NOTE — Telephone Encounter (Signed)
Call placed to pt re: appt 11/26/19 with Vin has been cancelled.  She is aware and will have to call back, as she was driving and couldn't get to her calendar, to reschedule. Appointment cancelled.

## 2019-11-26 ENCOUNTER — Ambulatory Visit: Payer: BC Managed Care – PPO | Admitting: Physician Assistant

## 2019-12-16 ENCOUNTER — Other Ambulatory Visit: Payer: Self-pay

## 2019-12-16 ENCOUNTER — Ambulatory Visit: Payer: BC Managed Care – PPO | Admitting: Cardiovascular Disease

## 2019-12-16 ENCOUNTER — Encounter: Payer: Self-pay | Admitting: Cardiovascular Disease

## 2019-12-16 VITALS — BP 124/86 | HR 70 | Ht 65.0 in | Wt 133.4 lb

## 2019-12-16 DIAGNOSIS — I428 Other cardiomyopathies: Secondary | ICD-10-CM | POA: Diagnosis not present

## 2019-12-16 NOTE — Patient Instructions (Signed)
Medication Instructions:  No changes *If you need a refill on your cardiac medications before your next appointment, please call your pharmacy*   Lab Work: none If you have labs (blood work) drawn today and your tests are completely normal, you will receive your results only by: . MyChart Message (if you have MyChart) OR . A paper copy in the mail If you have any lab test that is abnormal or we need to change your treatment, we will call you to review the results.   Testing/Procedures: Your physician has requested that you have an echocardiogram. Echocardiography is a painless test that uses sound waves to create images of your heart. It provides your doctor with information about the size and shape of your heart and how well your heart's chambers and valves are working. This procedure takes approximately one hour. There are no restrictions for this procedure.   Follow-Up: At [facility], you and your health needs are our priority.  As part of our continuing mission to provide you with exceptional heart care, we have created designated Provider Care Teams.  These Care Teams include your primary Cardiologist (physician) and Advanced Practice Providers (APPs -  Physician Assistants and Nurse Practitioners) who all work together to provide you with the care you need, when you need it.   Your next appointment:   12 month(s)  The format for your next appointment:   In Person  Provider:   You may see Christopher McAlhany, MD or one of the following Advanced Practice Providers on your designated Care Team:    Dayna Dunn, PA-C  Michele Lenze, PA-C   Other Instructions   

## 2019-12-16 NOTE — Progress Notes (Signed)
Chief Complaint  Patient presents with  . Follow-up    HTN   History of Present Illness: 49 yo female with history of HTN, HLD and non-ischemic cardiomyopathy who is here today for cardiac follow up. She was admitted to [facility] February 2021 with chest pain. Cardiac cath with no evidence of CAD. Her LVEF was 30-35% with wall motion abnormality c/w Takotsubo cardiomyopathy. She was started on a beta blocker and an Ace-inh.    She is here today for follow up. The patient denies any chest pain, dyspnea, palpitations, lower extremity edema, orthopnea, PND, dizziness, near syncope or syncope. She is feeling great.   Primary Care Physician: Martinique, Julie M, NP  Past Medical History:  Diagnosis Date  . Cervical cancer ([place])   . History of kidney stones   . Hyperlipidemia   . Hypertension   . Migraines   . Takotsubo cardiomyopathy 07/2019    Past Surgical History:  Procedure Laterality Date  . ABDOMINAL HYSTERECTOMY    . BREAST ENHANCEMENT SURGERY    . LEFT HEART CATH AND CORONARY ANGIOGRAPHY N/A 08/05/2019   Procedure: LEFT HEART CATH AND CORONARY ANGIOGRAPHY;  Surgeon: Wellington Hampshire, MD;  Location: [facility];  Service: Cardiovascular;  Laterality: N/A;    Current Outpatient Medications  Medication Sig Dispense Refill  . ALPRAZolam (XANAX) 0.5 MG tablet Take 0.25-0.5 mg by mouth daily as needed.    Marland Kitchen buPROPion (WELLBUTRIN XL) 150 MG 24 hr tablet Take 150 mg by mouth at bedtime.    Marland Kitchen escitalopram (LEXAPRO) 10 MG tablet Take 10 mg by mouth daily.    Marland Kitchen lisinopril (ZESTRIL) 10 MG tablet Take 10 mg by mouth daily.    . metoprolol succinate (TOPROL-XL) 25 MG 24 hr tablet Take 0.5 tablets (12.5 mg total) by mouth daily. 15 tablet 11   No current facility-administered medications for this visit.    Allergies  Allergen Reactions  . Penicillins Hives    Did it involve swelling of the face/tongue/throat, SOB, or low BP? No Did it involve sudden or severe rash/hives, skin  peeling, or any reaction on the inside of your mouth or nose? Unknown Did you need to seek medical attention at a hospital or doctor's office? No When did it last happen?Pt said she was very young If all above answers are "NO", may proceed with cephalosporin use.     Social History   Socioeconomic History  . Marital status: Married    Spouse name: Not on file  . Number of children: 1  . Years of education: Not on file  . Highest education level: Not on file  Occupational History  . Occupation: Retired Education officer, museum  Tobacco Use  . Smoking status: Never Smoker  . Smokeless tobacco: Never Used  Vaping Use  . Vaping Use: Never used  Substance and Sexual Activity  . Alcohol use: No  . Drug use: No  . Sexual activity: Not on file  Other Topics Concern  . Not on file  Social History Narrative  . Not on file   Social Determinants of Health   Financial Resource Strain:   . Difficulty of Paying Living Expenses:   Food Insecurity:   . Worried About Charity fundraiser in the Last Year:   . Arboriculturist in the Last Year:   Transportation Needs:   . Film/video editor (Medical):   Marland Kitchen Lack of Transportation (Non-Medical):   Physical Activity:   . Days of Exercise per  Week:   . Minutes of Exercise per Session:   Stress:   . Feeling of Stress :   Social Connections:   . Frequency of Communication with Friends and Family:   . Frequency of Social Gatherings with Friends and Family:   . Attends Religious Services:   . Active Member of Clubs or Organizations:   . Attends Archivist Meetings:   Marland Kitchen Marital Status:   Intimate Partner Violence:   . Fear of Current or Ex-Partner:   . Emotionally Abused:   Marland Kitchen Physically Abused:   . Sexually Abused:     Family History  Problem Relation Age of Onset  . CVA Mother   . Diabetes Father     Review of Systems:  As stated in the HPI and otherwise negative.   BP 124/86   Pulse 70   Ht 5\' 5"  (1.651 m)   Wt 133  lb 6.4 oz (60.5 kg)   SpO2 96%   BMI 22.20 kg/m   Physical Examination: General: Well developed, well nourished, NAD  HEENT: OP clear, mucus membranes moist  SKIN: warm, dry. No rashes. Neuro: No focal deficits  Musculoskeletal: Muscle strength 5/5 all ext  Psychiatric: Mood and affect normal  Neck: No JVD, no carotid bruits, no thyromegaly, no lymphadenopathy.  Lungs:Clear bilaterally, no wheezes, rhonci, crackles Cardiovascular: Regular rate and rhythm. No murmurs, gallops or rubs. Abdomen:Soft. Bowel sounds present. Non-tender.  Extremities: No lower extremity edema. Pulses are 2 + in the bilateral DP/PT.  EKG:  EKG is not ordered today. The ekg ordered today demonstrates   Echo 08/05/19: 1. Severely reduced LV EF with preserved to hyperdynamic basal function  and hypokinesis moving distally, to akinetic apex. This is consistent with  takotsubo cardiomyopathy. No LV thrombus visualized at the apex, though no  echo contrast used on this  study.  2. LVOT gradient 23 mmHg. Left ventricular ejection fraction, by  estimation, is 30 to 35%. The left ventricle has severely decreased  function. The left ventricle demonstrates regional wall motion  abnormalities (see scoring diagram/findings for  description). Indeterminate diastolic filling due to E-A fusion.  3. Right ventricular systolic function is normal. The right ventricular  size is normal. There is mildly elevated pulmonary artery systolic  pressure.  4. Mild systolic anterior motion of the MV anterior leaflet due to  hyperdynamic basal septal contraction.. The mitral valve is normal in  structure and function. Trivial mitral valve regurgitation. No evidence of  mitral stenosis.  5. The aortic valve is normal in structure and function. Aortic valve  regurgitation is not visualized. No aortic stenosis is present.  6. The inferior vena cava is normal in size with greater than 50%  respiratory variability, suggesting  right atrial pressure of 3 mmHg.   Cardiac cath 08/05/19: 1.  Normal coronary arteries.  Mild ostial RCA narrowing thought to be due to catheter induced spasm. 2.  Left ventricular angiography was not performed.  Echo showed moderately to severely reduced LV systolic function with wall motion abnormality consistent with stress-induced cardiomyopathy. 3.  Mildly elevated left ventricular end-diastolic pressure and mild LVOT gradient noted with pullback (25 mm systolic gradient).  Recommendations: The patient's findings are consistent with Takotsubo stress-induced cardiomyopathy.  Recommend medical therapy.  I started small dose metoprolol.  She is already on a small dose lisinopril.   Recent Labs: 08/05/2019: Hemoglobin 13.1; Platelets 246 08/06/2019: BUN 13; Creatinine, Ser 0.99; Potassium 4.0; Sodium 139   Lipid Panel    Component  Value Date/Time   CHOL 213 (H) 08/05/2019 0526   TRIG 121 08/05/2019 0526   HDL 68 08/05/2019 0526   CHOLHDL 3.1 08/05/2019 0526   VLDL 24 08/05/2019 0526   LDLCALC 121 (H) 08/05/2019 0526     Wt Readings from Last 3 Encounters:  12/16/19 133 lb 6.4 oz (60.5 kg)  08/21/19 139 lb 12 oz (63.4 kg)  08/06/19 143 lb 2 oz (64.9 kg)       Assessment and Plan:   1. Stress induced cardiomyopathy: She is doing well clinically. Will continue beta blocker and Ace-inh. Will repeat an echo now.   2. HTN: BP is well controlled. Continue current therapy  Current medicines are reviewed at length with the patient today.  The patient does not have concerns regarding medicines.  The following changes have been made:  no change  Labs/ tests ordered today include:   Orders Placed This Encounter  Procedures  . ECHOCARDIOGRAM COMPLETE     Disposition:   F/U with me in one year.    Signed, Lauree Chandler, MD 12/16/2019 3:52 PM    [facility] [address], [place], [place]  [address] Phone: [phone]; Fax: [phone]

## 2020-01-25 ENCOUNTER — Ambulatory Visit (HOSPITAL_COMMUNITY): Payer: BC Managed Care – PPO | Attending: Cardiology

## 2020-01-25 ENCOUNTER — Other Ambulatory Visit: Payer: Self-pay

## 2020-01-25 DIAGNOSIS — I428 Other cardiomyopathies: Secondary | ICD-10-CM | POA: Diagnosis present

## 2020-01-25 LAB — ECHOCARDIOGRAM COMPLETE
Area-P 1/2: 2.49 cm2
S' Lateral: 2.15 cm

## 2020-01-26 ENCOUNTER — Telehealth: Payer: Self-pay | Admitting: Cardiovascular Disease

## 2020-01-26 NOTE — Telephone Encounter (Signed)
New Message  Patient is returning call. Transferred to [facility].

## 2020-07-20 ENCOUNTER — Other Ambulatory Visit: Payer: Self-pay | Admitting: Physician Assistant

## 2020-12-16 ENCOUNTER — Other Ambulatory Visit: Payer: Self-pay | Admitting: Cardiovascular Disease

## 2021-01-30 ENCOUNTER — Other Ambulatory Visit: Payer: Self-pay | Admitting: *Deleted

## 2021-01-30 MED ORDER — METOPROLOL SUCCINATE ER 25 MG PO TB24: 12.5000 mg | ORAL_TABLET | Freq: Every day | ORAL | 0 refills | Status: AC

## 2021-04-12 NOTE — Progress Notes (Signed)
Cardiology Office Note    Date:  04/19/2021   ID:  Kylie Rosario, DOB [birth year], MRN [id number]   PCP:  [place], Julie M, NP   [place]  Cardiologist:  Lauree Chandler, MD   Advanced Practice Provider:  No care team member to display Electrophysiologist:  None   [phone]   No chief complaint on file.   History of Present Illness:  Kylie Rosario is a 50 y.o. female with history of hypertension, HLD, and ICM EF 30 to 35% with wall motion abnormalities consistent with Takotsubo cardiomyopathy 2021 cardiac cath no evidence of CAD.  Patient last saw Dr. Angelena Form 12/16/2019 and was doing well on low-dose beta-blocker and lisinopril.  Repeat echo 01/25/2020 normal LVEF 70 to 75% no wall motion abnormalities.  Patient comes in for f/u. Denies chest pain, dyspnea, palpitations, dizziness, edema. Walk all day at work. When she tries to exert herself she can't get her HR up on beta blocker.     Past Medical History:  Diagnosis Date   Cervical cancer ([place])    History of kidney stones    Hyperlipidemia    Hypertension    Migraines    Takotsubo cardiomyopathy 07/2019    Past Surgical History:  Procedure Laterality Date   ABDOMINAL HYSTERECTOMY     BREAST ENHANCEMENT SURGERY     LEFT HEART CATH AND CORONARY ANGIOGRAPHY N/A 08/05/2019   Procedure: LEFT HEART CATH AND CORONARY ANGIOGRAPHY;  Surgeon: Wellington Hampshire, MD;  Location: [facility];  Service: Cardiovascular;  Laterality: N/A;    Current Medications: Current Meds  Medication Sig   ALPRAZolam (XANAX) 0.5 MG tablet Take 0.25-0.5 mg by mouth daily as needed.   buPROPion (WELLBUTRIN XL) 150 MG 24 hr tablet Take 150 mg by mouth at bedtime.   escitalopram (LEXAPRO) 10 MG tablet Take 10 mg by mouth daily.   lisinopril (ZESTRIL) 10 MG tablet Take 10 mg by mouth daily.   metoprolol succinate (TOPROL-XL) 25 MG 24 hr tablet Take 0.5 tablets (12.5 mg total) by mouth daily. Please schedule  appt for future refills. 1st attempt     Allergies:   Penicillins   Social History   Socioeconomic History   Marital status: Married    Spouse name: Not on file   Number of children: 1   Years of education: Not on file   Highest education level: Not on file  Occupational History   Occupation: Retired Education officer, museum  Tobacco Use   Smoking status: Never   Smokeless tobacco: Never  Vaping Use   Vaping Use: Never used  Substance and Sexual Activity   Alcohol use: No   Drug use: No   Sexual activity: Not on file  Other Topics Concern   Not on file  Social History Narrative   Not on file   Social Determinants of Health   Financial Resource Strain: Not on file  Food Insecurity: Not on file  Transportation Needs: Not on file  Physical Activity: Not on file  Stress: Not on file  Social Connections: Not on file     Family History:  The patient's  family history includes CVA in her mother; Diabetes in her father.   ROS:   Please see the history of present illness.    ROS All other systems reviewed and are negative.   PHYSICAL EXAM:   VS:  BP 126/68   Pulse 65   Ht 5\' 5"  (1.651 m)   Wt 141 lb (64  kg)   SpO2 98%   BMI 23.46 kg/m   Physical Exam  GEN: Thin, in no acute distress  Neck: no JVD, carotid bruits, or masses Cardiac:RRR; no murmurs, rubs, or gallops  Respiratory:  clear to auscultation bilaterally, normal work of breathing GI: soft, nontender, nondistended, + BS Ext: without cyanosis, clubbing, or edema, Good distal pulses bilaterally Neuro:  Alert and Oriented x 3 Psych: euthymic mood, full affect  Wt Readings from Last 3 Encounters:  04/19/21 141 lb (64 kg)  12/16/19 133 lb 6.4 oz (60.5 kg)  08/21/19 139 lb 12 oz (63.4 kg)      Studies/Labs Reviewed:   EKG:  EKG is  ordered today.  The ekg ordered today demonstrates NSR with PVC, nonspecific ST changes  Recent Labs: No results found for requested labs within last 8760 hours.   Lipid Panel     Component Value Date/Time   CHOL 213 (H) 08/05/2019 0526   TRIG 121 08/05/2019 0526   HDL 68 08/05/2019 0526   CHOLHDL 3.1 08/05/2019 0526   VLDL 24 08/05/2019 0526   LDLCALC 121 (H) 08/05/2019 0526    Additional studies/ records that were reviewed today include:  2D echo 01/25/2020 IMPRESSIONS     1. Left ventricular ejection fraction, by estimation, is 70 to 75%. The  left ventricle has hyperdynamic function. The left ventricle has no  regional wall motion abnormalities. Left ventricular diastolic parameters  are indeterminate.   2. Right ventricular systolic function is normal. The right ventricular  size is normal.   3. The mitral valve is normal in structure. Mild mitral valve  regurgitation. No evidence of mitral stenosis.   4. The aortic valve is normal in structure. Aortic valve regurgitation is  not visualized. No aortic stenosis is present.   5. The inferior vena cava is normal in size with greater than 50%  respiratory variability, suggesting right atrial pressure of 3 mmHg.   Comparison(s): The left ventricular function has improved.    Risk Assessment/Calculations:         ASSESSMENT:    1. Takotsubo cardiomyopathy   2. Essential hypertension   3. Mixed hyperlipidemia      PLAN:  In order of problems listed above:  Takotsubo cardiomyopathy 2021 with improvement of LVEF 70 to 75% on echo 01/2020.  Overall doing well without any cardiac symptoms.  Would like to come off beta-blocker so she can exercise.  Will discuss with Dr. Angelena Form.  Hypertension well-controlled on lisinopril and metoprolol.  HLD LDL 91 03/15/2021 other labs reviewed from [place] and were stable.  Shared Decision Making/Informed Consent        Medication Adjustments/Labs and Tests Ordered: Current medicines are reviewed at length with the patient today.  Concerns regarding medicines are outlined above.  Medication changes, Labs and Tests ordered today are listed in the  Patient Instructions below. Patient Instructions  Medication Instructions:  Your physician recommends that you continue on your current medications as directed. Please refer to the Current Medication list given to you today.  *If you need a refill on your cardiac medications before your next appointment, please call your pharmacy*   Lab Work: None  If you have labs (blood work) drawn today and your tests are completely normal, you will receive your results only by: [place] (if you have MyChart) OR A paper copy in the mail If you have any lab test that is abnormal or we need to change your treatment, we will call you  to review the results.   Follow-Up: At [facility], you and your health needs are our priority.  As part of our continuing mission to provide you with exceptional heart care, we have created designated Provider Care Teams.  These Care Teams include your primary Cardiologist (physician) and Advanced Practice Providers (APPs -  Physician Assistants and Nurse Practitioners) who all work together to provide you with the care you need, when you need it.   Your next appointment:   1 year(s)  The format for your next appointment:   In Person  Provider:   You may see Lauree Chandler, MD or one of the following Advanced Practice Providers on your designated Care Team:   Melina Copa, PA-C Ermalinda Barrios, PA-C      Signed, Ermalinda Barrios, PA-C  04/19/2021 11:32 AM    [address], [place], [place]  [address] Phone: [phone]; Fax: [phone]

## 2021-04-19 ENCOUNTER — Other Ambulatory Visit: Payer: Self-pay

## 2021-04-19 ENCOUNTER — Encounter: Payer: Self-pay | Admitting: Physician Assistant

## 2021-04-19 ENCOUNTER — Ambulatory Visit (INDEPENDENT_AMBULATORY_CARE_PROVIDER_SITE_OTHER): Payer: BLUE CROSS/BLUE SHIELD | Admitting: Physician Assistant

## 2021-04-19 VITALS — BP 126/68 | HR 65 | Ht 65.0 in | Wt 141.0 lb

## 2021-04-19 DIAGNOSIS — I10 Essential (primary) hypertension: Secondary | ICD-10-CM | POA: Diagnosis not present

## 2021-04-19 DIAGNOSIS — E782 Mixed hyperlipidemia: Secondary | ICD-10-CM

## 2021-04-19 DIAGNOSIS — I5181 Takotsubo syndrome: Secondary | ICD-10-CM | POA: Diagnosis not present

## 2021-04-19 NOTE — Patient Instructions (Signed)
Medication Instructions:  Your physician recommends that you continue on your current medications as directed. Please refer to the Current Medication list given to you today.  *If you need a refill on your cardiac medications before your next appointment, please call your pharmacy*   Lab Work: None  If you have labs (blood work) drawn today and your tests are completely normal, you will receive your results only by: [place] (if you have MyChart) OR A paper copy in the mail If you have any lab test that is abnormal or we need to change your treatment, we will call you to review the results.   Follow-Up: At [facility], you and your health needs are our priority.  As part of our continuing mission to provide you with exceptional heart care, we have created designated Provider Care Teams.  These Care Teams include your primary Cardiologist (physician) and Advanced Practice Providers (APPs -  Physician Assistants and Nurse Practitioners) who all work together to provide you with the care you need, when you need it.   Your next appointment:   1 year(s)  The format for your next appointment:   In Person  Provider:   You may see Lauree Chandler, MD or one of the following Advanced Practice Providers on your designated Care Team:   Melina Copa, PA-C Ermalinda Barrios, PA-C

## 2021-11-03 IMAGING — CT CT ANGIO CHEST
2 of 6 series · 18 of 46 positions shown · IV contrast (omnipaque)
Comparison: Same day chest x-ray

CLINICAL DATA: Shortness of breath, chest pain

EXAM:
CT ANGIOGRAPHY CHEST WITH CONTRAST
TECHNIQUE: Multidetector CT imaging of the chest was performed using the
standard protocol during bolus administration of intravenous
contrast. Multiplanar CT image reconstructions and MIPs were
obtained to evaluate the vascular anatomy.
CONTRAST:  100mL OMNIPAQUE IOHEXOL 350 MG/ML SOLN

[Series 6: thins · axial · 0.76mm/px · z∈[+1272,+1520]mm · 15 of 271 slices shown]
[im 12/271  lung]
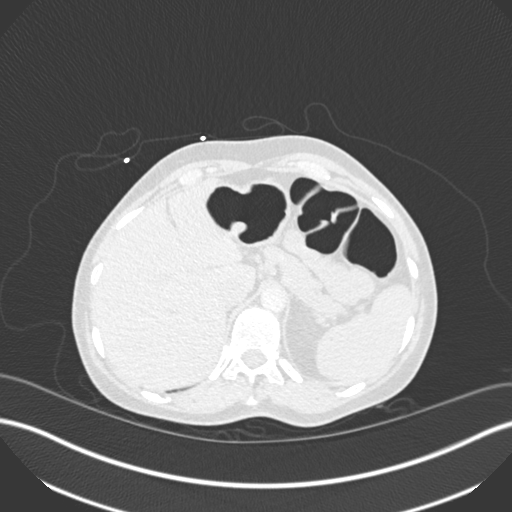
[im 36/271  soft-tissue]
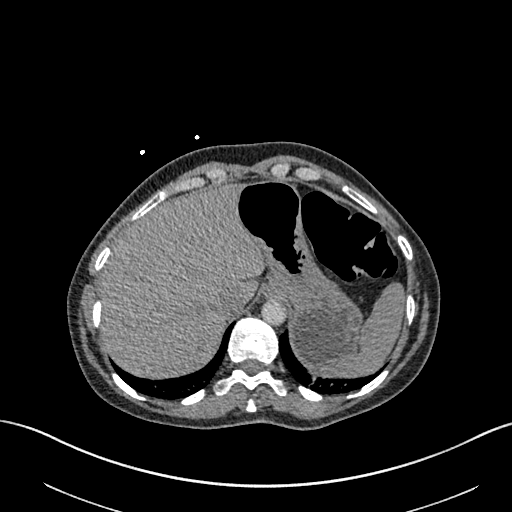
[im 47/271  lung]
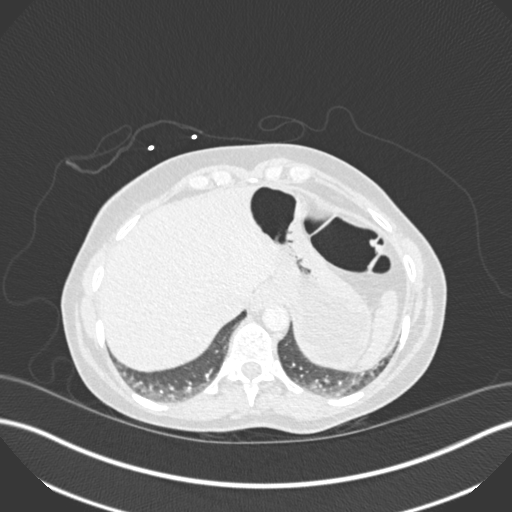
[im 71/271  soft-tissue]
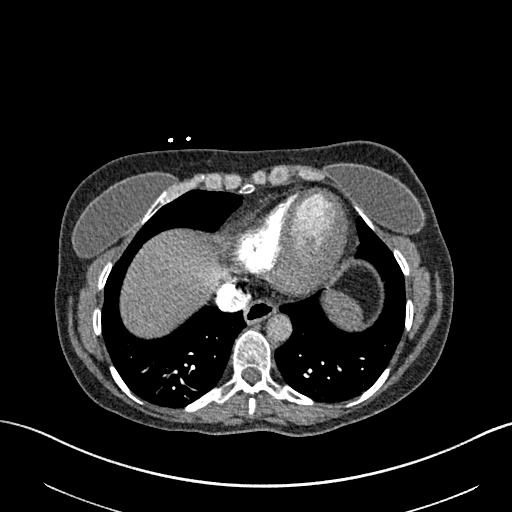
[im 83/271  lung]
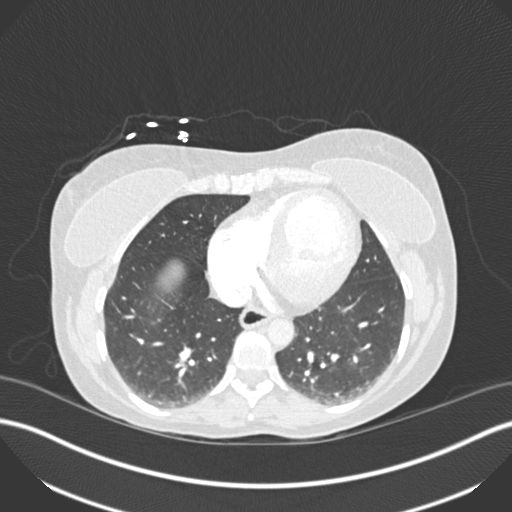
[im 106/271  soft-tissue]
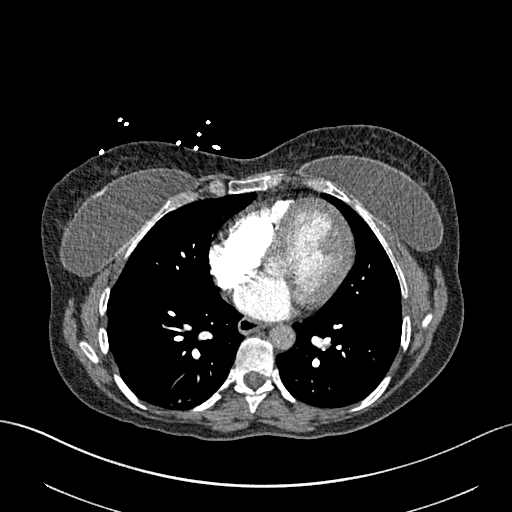
[im 118/271  lung]
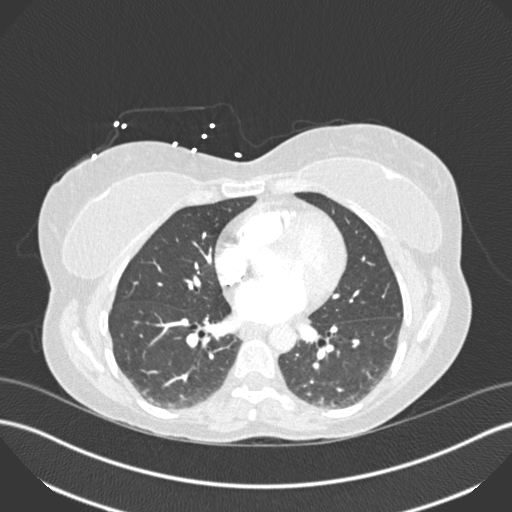
[im 141/271  soft-tissue]
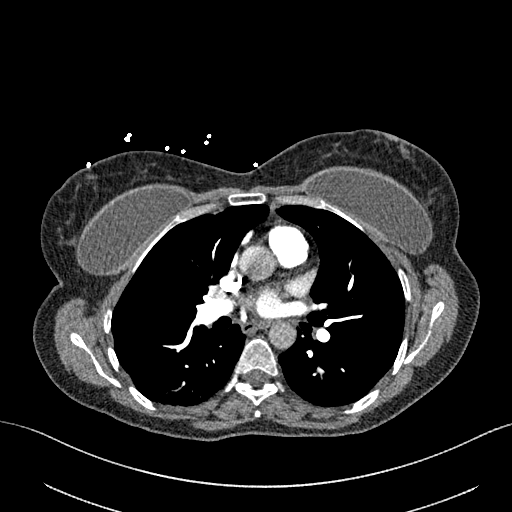
[im 153/271  lung]
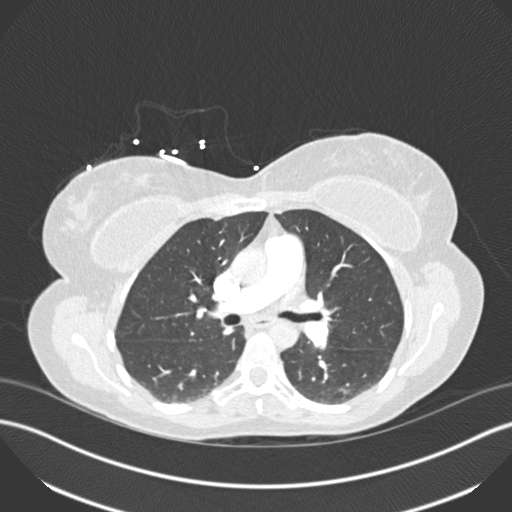
[im 165/271  soft-tissue]
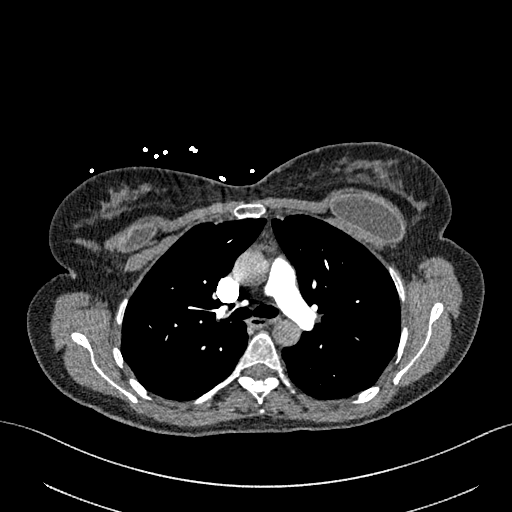
[im 188/271  lung]
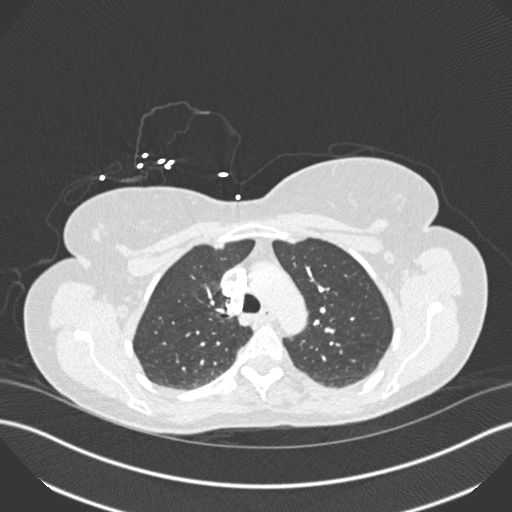
[im 200/271  soft-tissue]
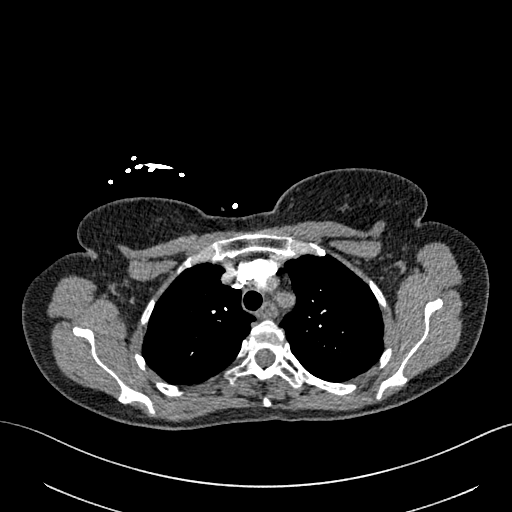
[im 224/271  lung]
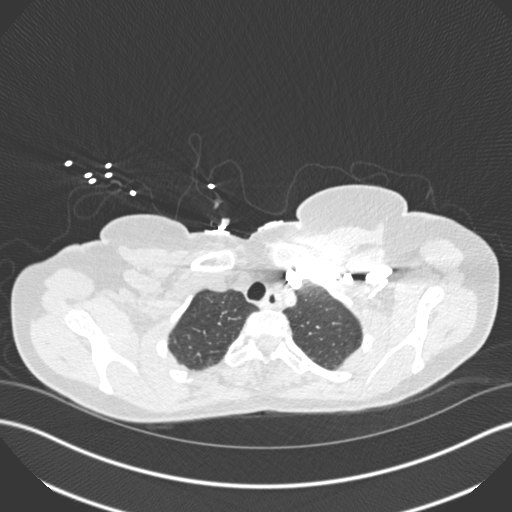
[im 235/271  soft-tissue]
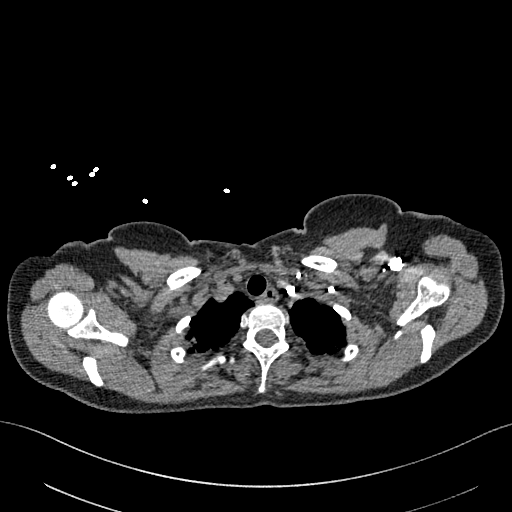
[im 259/271  lung]
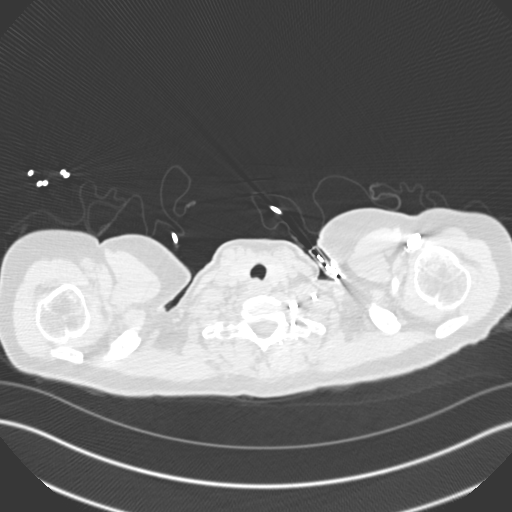

[Series 8: coronal mpr · coronal · 0.55mm/px · 3 of 122 slices shown]
[im 31/122  soft-tissue]
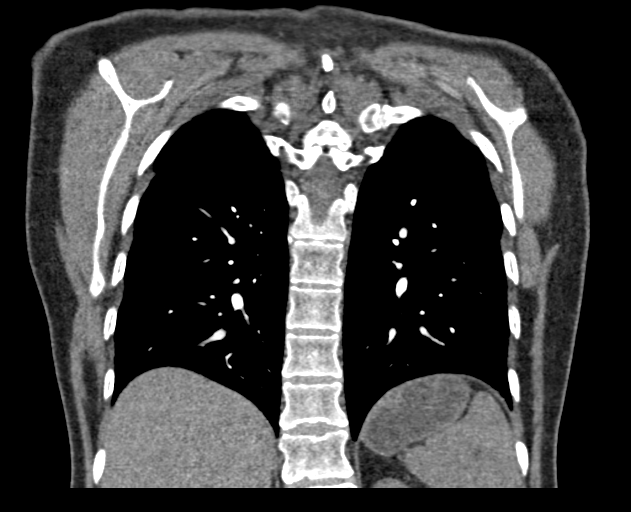
[im 61/122  soft-tissue]
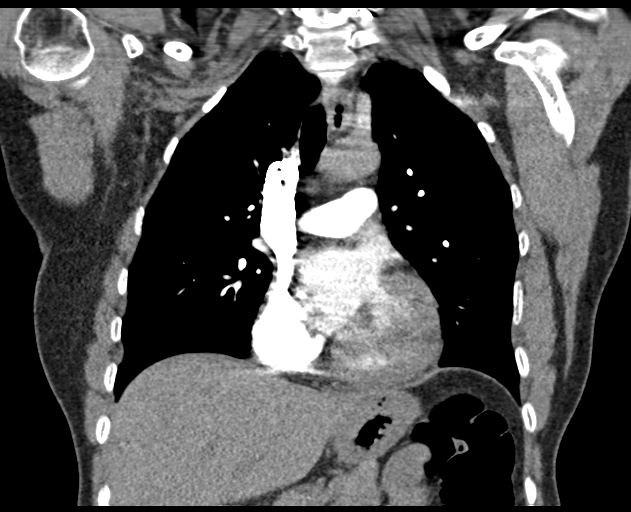
[im 91/122  soft-tissue]
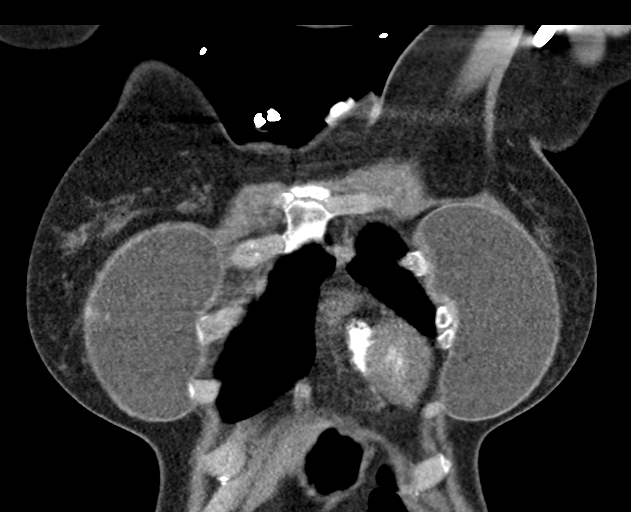

[18 of 46 positions shown; findings below may reference images not displayed]

FINDINGS: Cardiovascular: Satisfactory opacification of the pulmonary arteries
to the segmental level. No evidence of pulmonary embolism. Normal
heart size. No pericardial effusion. Thoracic aorta is
nonaneurysmal.

Mediastinum/Nodes: No enlarged mediastinal, hilar, or axillary lymph
nodes. Thyroid gland, trachea, and esophagus demonstrate no
significant findings.

Lungs/Pleura: Minimal bibasilar dependent subsegmental atelectasis.
Calcified granuloma within the posterior left lung base. No focal
airspace consolidation, pleural effusion, or pneumothorax.

Upper Abdomen: No acute abnormality.

Musculoskeletal: No chest wall abnormality. No acute or significant
osseous findings. Bilateral breast prostheses.

Review of the MIP images confirms the above findings.
IMPRESSION: Negative for pulmonary embolism or other acute intrathoracic
abnormality.

## 2021-11-03 IMAGING — CR DG CHEST 2V
2 series · 2 of 2 positions shown · non-contrast
Comparison: None.

CLINICAL DATA: Chest pain

EXAM:
CHEST - 2 VIEW

[chest pa]
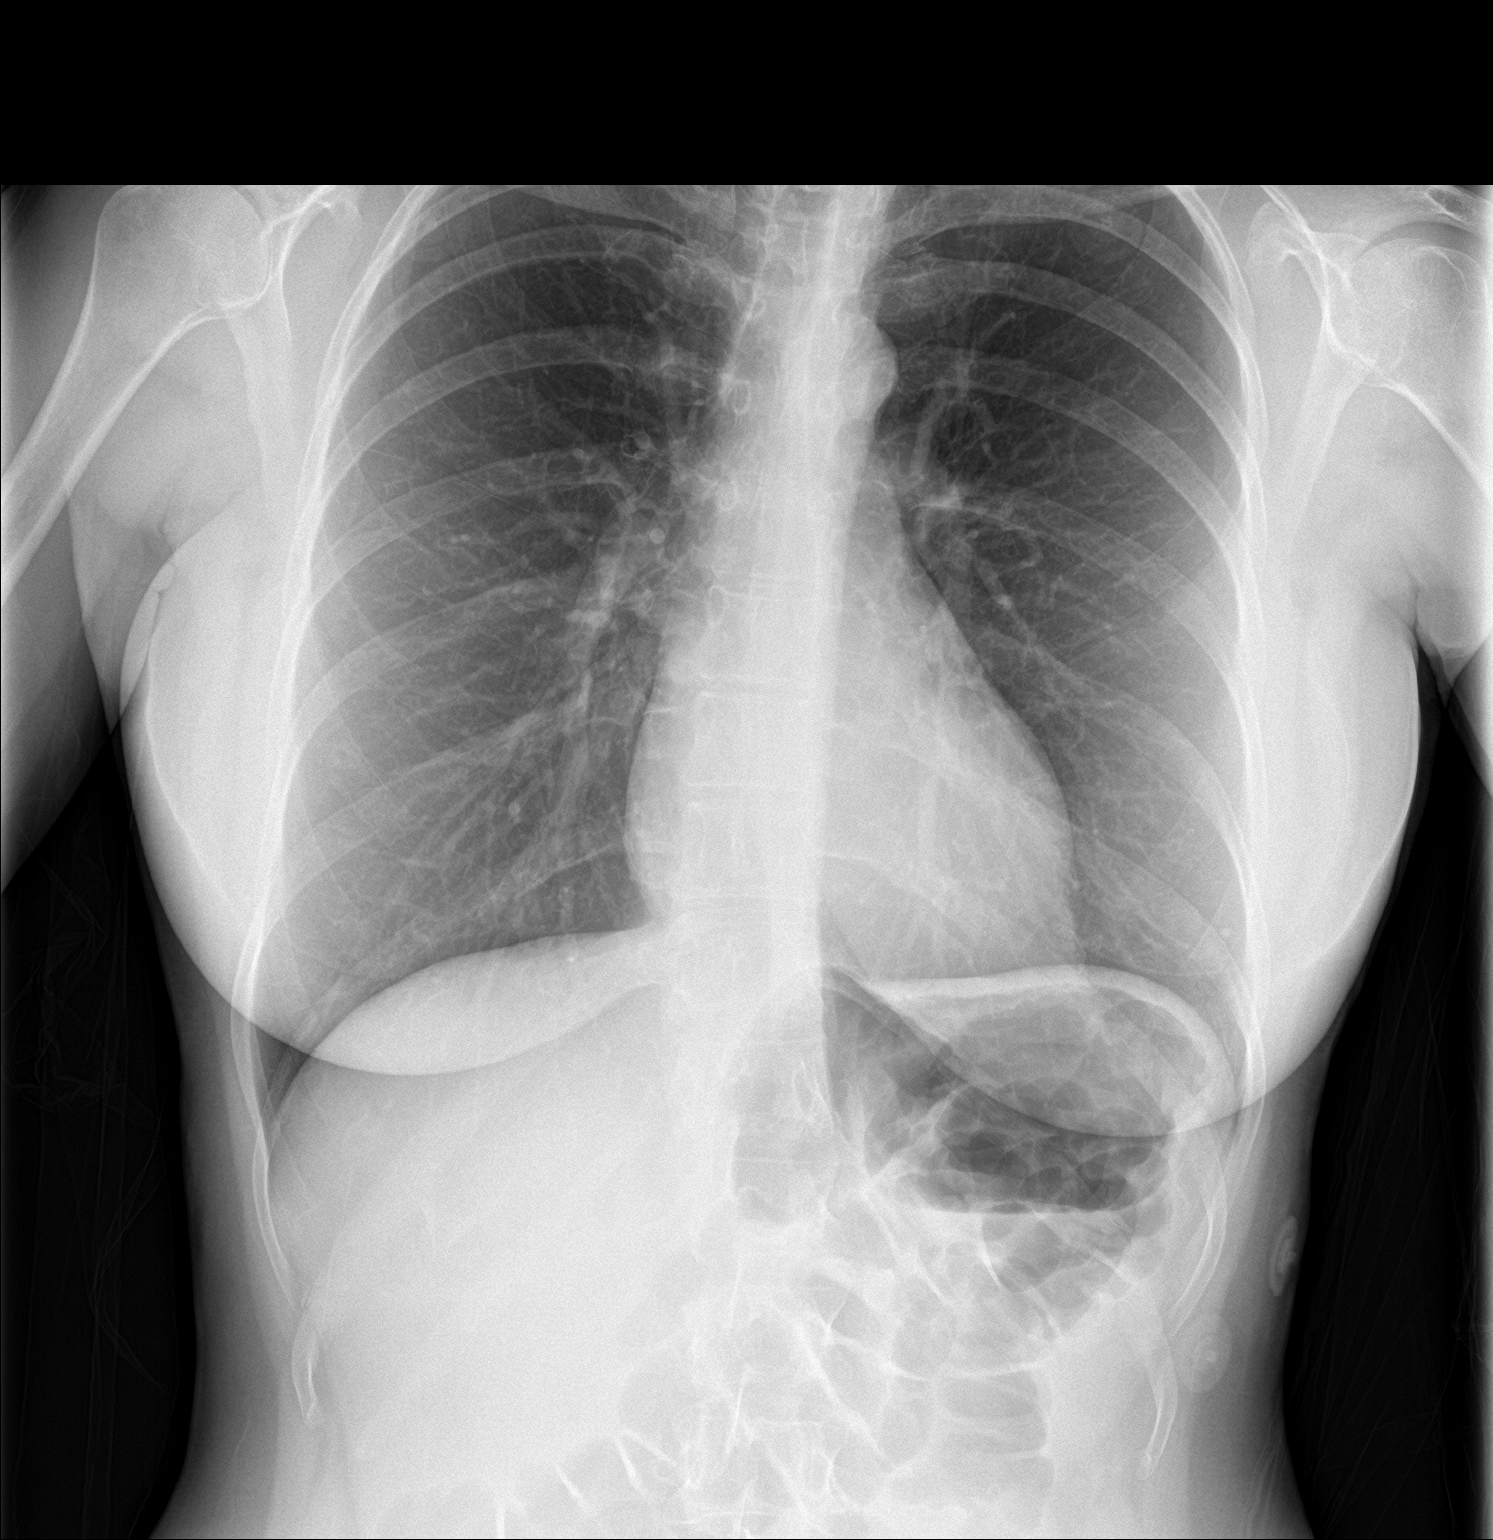

[chest lat]
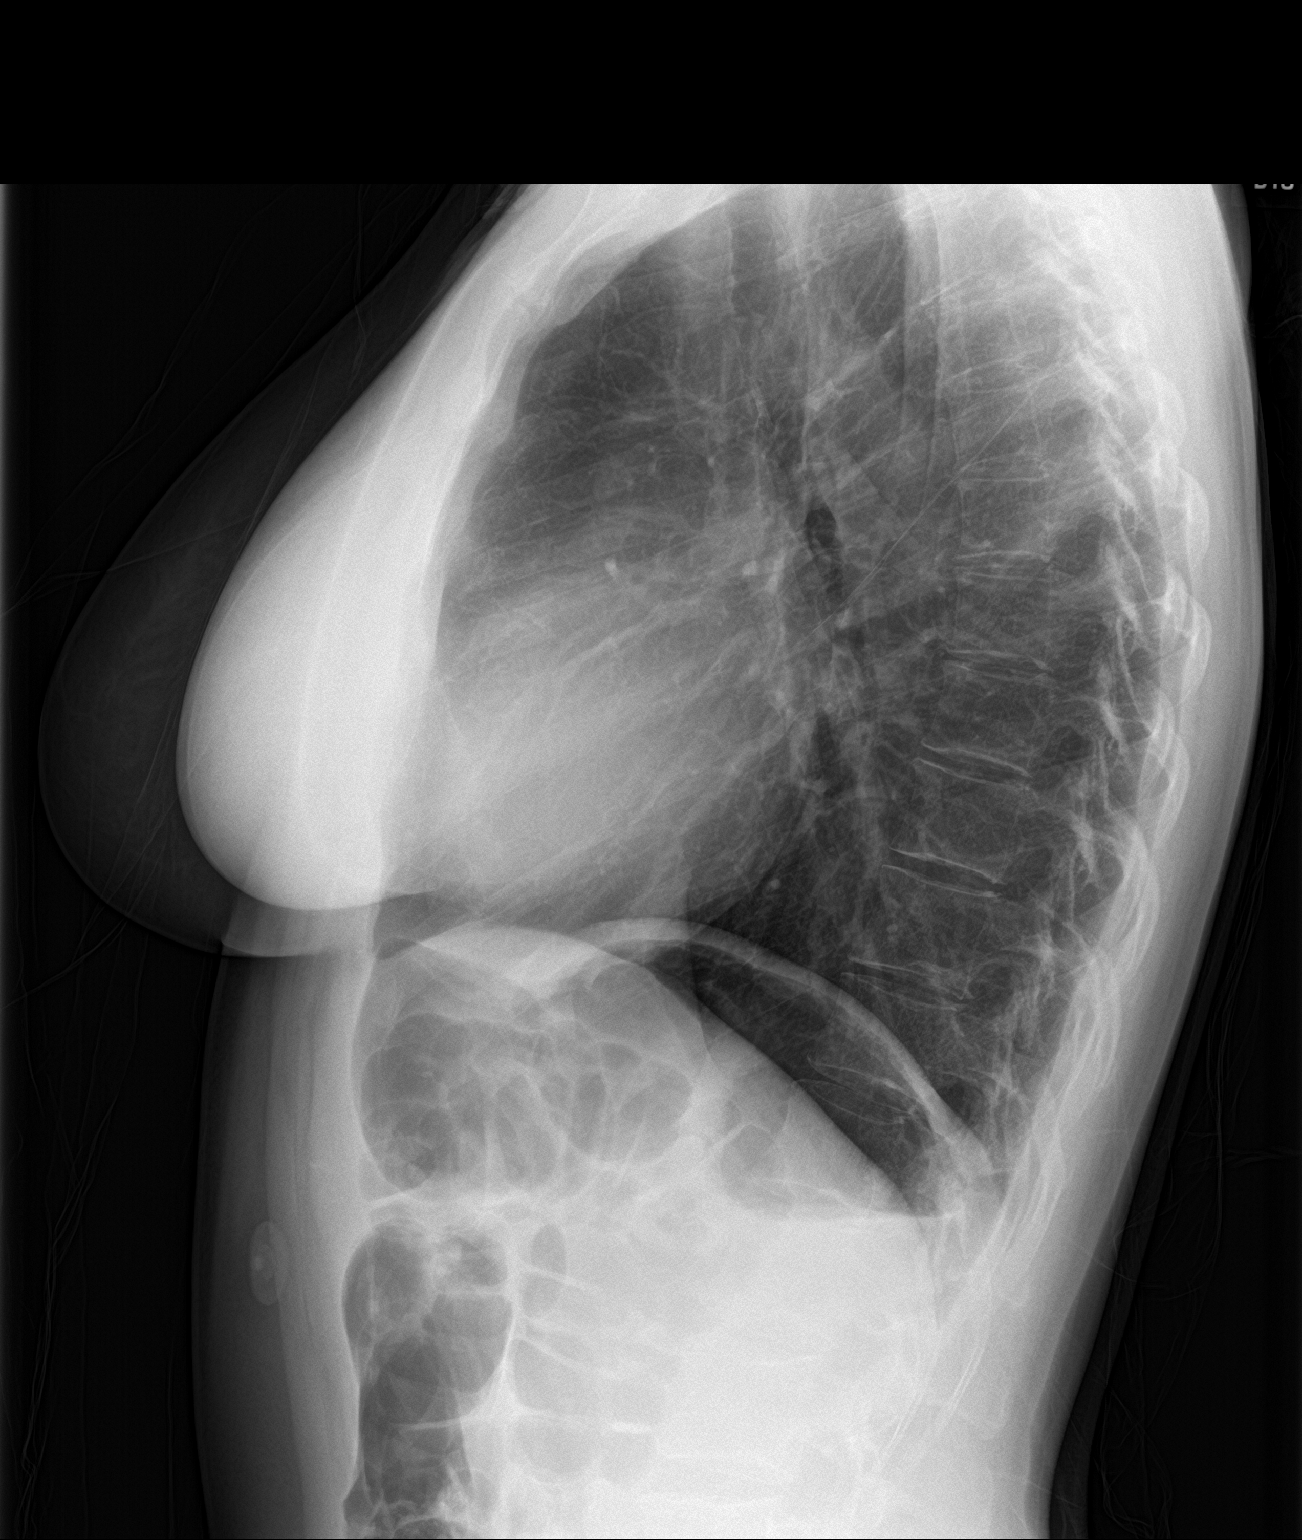

[2 of 2 positions shown; findings below may reference images not displayed]

FINDINGS: The heart size and mediastinal contours are within normal limits.
Both lungs are clear. The visualized skeletal structures are
unremarkable.
IMPRESSION: No active cardiopulmonary disease.

## 2022-08-13 ENCOUNTER — Ambulatory Visit: Payer: BLUE CROSS/BLUE SHIELD | Admitting: Cardiovascular Disease

## 2024-01-09 ENCOUNTER — Other Ambulatory Visit: Payer: Self-pay | Admitting: Medical Genetics

## 2024-01-20 ENCOUNTER — Other Ambulatory Visit (HOSPITAL_COMMUNITY): Payer: Self-pay

## 2024-01-22 ENCOUNTER — Other Ambulatory Visit (HOSPITAL_COMMUNITY)
Admission: RE | Admit: 2024-01-22 | Discharge: 2024-01-22 | Disposition: A | Discharge status: Home / Self Care | Payer: Self-pay | Source: Ambulatory Visit | Attending: Medical Genetics | Admitting: Medical Genetics

## 2024-02-02 LAB — GENECONNECT MOLECULAR SCREEN: Genetic Analysis Overall Interpretation: NEGATIVE
